# Patient Record
Sex: Male | Born: 1950
Health system: Southern US, Community
[De-identification: ages and names within clinical notes are randomized; demographics above are authoritative.]

## PROBLEM LIST (undated history)

## (undated) DIAGNOSIS — E119 Type 2 diabetes mellitus without complications: Secondary | ICD-10-CM

## (undated) DIAGNOSIS — I10 Essential (primary) hypertension: Secondary | ICD-10-CM

## (undated) DIAGNOSIS — R0683 Snoring: Secondary | ICD-10-CM

## (undated) DIAGNOSIS — E785 Hyperlipidemia, unspecified: Secondary | ICD-10-CM

## (undated) HISTORY — PX: KNEE SURGERY: SHX244

## (undated) HISTORY — PX: BACK SURGERY: SHX140

## (undated) HISTORY — DX: Snoring: R06.83

## (undated) HISTORY — PX: COLONOSCOPY: SHX174

## (undated) HISTORY — PX: KNEE ARTHROSCOPY: SHX127

## (undated) HISTORY — PX: OTHER SURGICAL HISTORY: SHX169

---

## 1997-12-21 ENCOUNTER — Ambulatory Visit (HOSPITAL_COMMUNITY): Admission: RE | Admit: 1997-12-21 | Discharge: 1997-12-21 | Payer: Self-pay | Admitting: Family Medicine

## 1999-01-23 ENCOUNTER — Encounter: Admission: RE | Admit: 1999-01-23 | Discharge: 1999-04-23 | Payer: Self-pay | Admitting: Family Medicine

## 1999-06-21 ENCOUNTER — Encounter: Admission: RE | Admit: 1999-06-21 | Discharge: 1999-09-19 | Payer: Self-pay | Admitting: Family Medicine

## 2005-07-11 ENCOUNTER — Ambulatory Visit (HOSPITAL_BASED_OUTPATIENT_CLINIC_OR_DEPARTMENT_OTHER): Admission: RE | Admit: 2005-07-11 | Discharge: 2005-07-11 | Payer: Self-pay | Admitting: Orthopedic Surgery

## 2009-02-15 ENCOUNTER — Encounter: Admission: RE | Admit: 2009-02-15 | Discharge: 2009-02-15 | Payer: Self-pay | Admitting: Internal Medicine

## 2009-03-09 ENCOUNTER — Encounter: Admission: RE | Admit: 2009-03-09 | Discharge: 2009-03-09 | Payer: Self-pay | Admitting: Orthopedic Surgery

## 2009-03-30 ENCOUNTER — Ambulatory Visit (HOSPITAL_COMMUNITY): Admission: RE | Admit: 2009-03-30 | Discharge: 2009-03-31 | Payer: Self-pay | Admitting: Neurosurgery

## 2010-06-25 ENCOUNTER — Encounter: Payer: Self-pay | Admitting: Internal Medicine

## 2010-09-07 LAB — BASIC METABOLIC PANEL
BUN: 10 mg/dL (ref 6–23)
CO2: 28 mEq/L (ref 19–32)
Calcium: 9.7 mg/dL (ref 8.4–10.5)
Chloride: 106 mEq/L (ref 96–112)
Creatinine, Ser: 0.96 mg/dL (ref 0.4–1.5)
GFR calc Af Amer: >60 mL/min (ref >60)
GFR calc non Af Amer: >60 mL/min (ref >60)
Glucose, Bld: 95 mg/dL (ref 70–99)
Potassium: 4.3 mEq/L (ref 3.5–5.1)
Sodium: 142 mEq/L (ref 135–145)

## 2010-09-07 LAB — CBC
HCT: 45.7 % (ref 39.0–52.0)
Hemoglobin: 16 g/dL (ref 13.0–17.0)
MCHC: 35 g/dL (ref 30.0–36.0)
MCV: 90.7 fL (ref 78.0–100.0)
Platelets: 234 10*3/uL (ref 150–400)
RBC: 5.04 MIL/uL (ref 4.22–5.81)
RDW: 14.2 % (ref 11.5–15.5)
WBC: 6.6 10*3/uL (ref 4.0–10.5)

## 2010-10-20 NOTE — Op Note (Signed)
NAME:  Jeffrey Woodward, Jeffrey Woodward NO.:  [id number]   MEDICAL RECORD NO.:  [id number]          PATIENT TYPE:  AMB   LOCATION:  DSC                          FACILITY:  [facility]   PHYSICIAN:  Mila Homer. Sherlean Foot, M.D. DATE OF BIRTH:  [birth year]   DATE OF PROCEDURE:  07/11/2005  DATE OF DISCHARGE:                                 OPERATIVE REPORT   PREOPERATIVE DIAGNOSIS:  Right knee osteoarthritis and medial meniscal tear.   POSTOPERATIVE DIAGNOSIS:  Right knee osteoarthritis and medial meniscal  tear.   OPERATION PERFORMED:  Right knee arthroscopy, partial medial meniscectomy,  chondroplasty in the medial and patellofemoral compartments.   SURGEON:  Mila Homer. Sherlean Foot, M.D.   ASSISTANT:  None.   ANESTHESIA:  MAC.   INDICATIONS FOR PROCEDURE:  The patient is a 60 year old white male with  mechanical symptoms as well as some chronic underlying osteoarthritic  symptoms.  Radiographic evidence of some arthritis as well.  No MRI was  performed.  Informed consent was obtained.   DESCRIPTION OF PROCEDURE:  The patient was laid supine and administered MAC  anesthesia.  The right lower extremity was prepped and draped in the usual  sterile fashion.  Inferolateral and inferomedial portals were created with a  #11 blade, blunt trocar and cannula.  Diagnostic arthroscopy revealed  minimal chondromalacia on the patella.  In the superior trochlea, the  cartilage looked good but down in the inferior portion it got down to grade  3 chondromalacia very quickly.  I debrided this area with a [place]  shaver, removing loose articular surface margins.  I then went into the  medial compartment where he had significant grade 3 changes throughout most  of the medial compartment as well as a very complex posterior horn medial  meniscal tear.  I used the straight basket forceps and the [employer] as well as the ArthroCare debridement want to perform partial medial  meniscectomy.  The  lateral compartment looked very good.  No pathology was  there.  The ACL and PCL looked good but there were osteophytes in the notch.  I then went back to the patellofemoral joint, debrided some more there,  finished the chondroplasty and then lavaged and closed with 4-0 nylon  sutures.  I then dressed with Xeroform dressing sponges, sterile Webril and  an Ace wrap.  I did infiltrate 10 mL of a Marcaine morphine mixture in both  portals.   COMPLICATIONS:  None.   DRAINS:  None.   TOURNIQUET TIME:  None.           ______________________________  Mila Homer. Sherlean Foot, M.D.     SDL/MEDQ  D:  07/11/2005  T:  07/11/2005  Job:  [id number]

## 2011-12-03 ENCOUNTER — Other Ambulatory Visit: Payer: Self-pay | Admitting: Cardiology

## 2013-04-08 ENCOUNTER — Other Ambulatory Visit: Payer: Self-pay | Admitting: Internal Medicine

## 2013-06-08 ENCOUNTER — Ambulatory Visit
Admission: RE | Admit: 2013-06-08 | Discharge: 2013-06-08 | Disposition: A | Discharge status: Home / Self Care | Payer: BC Managed Care – PPO | Source: Ambulatory Visit | Attending: Internal Medicine | Admitting: Internal Medicine

## 2013-06-08 DIAGNOSIS — R740 Nonspecific elevation of levels of transaminase and lactic acid dehydrogenase [LDH]: Principal | ICD-10-CM

## 2013-06-08 DIAGNOSIS — R7402 Elevation of levels of lactic acid dehydrogenase (LDH): Secondary | ICD-10-CM

## 2014-07-20 DIAGNOSIS — L821 Other seborrheic keratosis: Secondary | ICD-10-CM | POA: Diagnosis not present

## 2014-07-20 DIAGNOSIS — D1801 Hemangioma of skin and subcutaneous tissue: Secondary | ICD-10-CM | POA: Diagnosis not present

## 2014-07-20 DIAGNOSIS — B351 Tinea unguium: Secondary | ICD-10-CM | POA: Diagnosis not present

## 2014-07-20 DIAGNOSIS — B369 Superficial mycosis, unspecified: Secondary | ICD-10-CM | POA: Diagnosis not present

## 2014-07-20 DIAGNOSIS — L814 Other melanin hyperpigmentation: Secondary | ICD-10-CM | POA: Diagnosis not present

## 2014-10-28 DIAGNOSIS — M5116 Intervertebral disc disorders with radiculopathy, lumbar region: Secondary | ICD-10-CM | POA: Diagnosis not present

## 2014-10-28 DIAGNOSIS — M9901 Segmental and somatic dysfunction of cervical region: Secondary | ICD-10-CM | POA: Diagnosis not present

## 2014-10-28 DIAGNOSIS — M542 Cervicalgia: Secondary | ICD-10-CM | POA: Diagnosis not present

## 2014-10-28 DIAGNOSIS — M9903 Segmental and somatic dysfunction of lumbar region: Secondary | ICD-10-CM | POA: Diagnosis not present

## 2014-10-28 DIAGNOSIS — M9902 Segmental and somatic dysfunction of thoracic region: Secondary | ICD-10-CM | POA: Diagnosis not present

## 2014-10-31 ENCOUNTER — Emergency Department (HOSPITAL_COMMUNITY): Payer: BLUE CROSS/BLUE SHIELD

## 2014-10-31 ENCOUNTER — Encounter (HOSPITAL_COMMUNITY): Payer: Self-pay | Admitting: Family Medicine

## 2014-10-31 ENCOUNTER — Inpatient Hospital Stay (HOSPITAL_COMMUNITY)
Admission: EM | Admit: 2014-10-31 | Discharge: 2014-11-03 | DRG: 439 | Disposition: A | Discharge status: Home / Self Care | Payer: BLUE CROSS/BLUE SHIELD | Attending: Internal Medicine | Admitting: Internal Medicine

## 2014-10-31 DIAGNOSIS — R079 Chest pain, unspecified: Secondary | ICD-10-CM | POA: Diagnosis not present

## 2014-10-31 DIAGNOSIS — K859 Acute pancreatitis without necrosis or infection, unspecified: Secondary | ICD-10-CM | POA: Diagnosis present

## 2014-10-31 DIAGNOSIS — E872 Acidosis: Secondary | ICD-10-CM | POA: Diagnosis present

## 2014-10-31 DIAGNOSIS — E119 Type 2 diabetes mellitus without complications: Secondary | ICD-10-CM | POA: Diagnosis present

## 2014-10-31 DIAGNOSIS — R112 Nausea with vomiting, unspecified: Secondary | ICD-10-CM

## 2014-10-31 DIAGNOSIS — K760 Fatty (change of) liver, not elsewhere classified: Secondary | ICD-10-CM | POA: Diagnosis present

## 2014-10-31 DIAGNOSIS — I10 Essential (primary) hypertension: Secondary | ICD-10-CM | POA: Diagnosis not present

## 2014-10-31 DIAGNOSIS — K30 Functional dyspepsia: Secondary | ICD-10-CM | POA: Diagnosis present

## 2014-10-31 DIAGNOSIS — E781 Pure hyperglyceridemia: Secondary | ICD-10-CM | POA: Diagnosis present

## 2014-10-31 DIAGNOSIS — R0602 Shortness of breath: Secondary | ICD-10-CM | POA: Diagnosis not present

## 2014-10-31 DIAGNOSIS — R001 Bradycardia, unspecified: Secondary | ICD-10-CM | POA: Diagnosis present

## 2014-10-31 DIAGNOSIS — E780 Pure hypercholesterolemia: Secondary | ICD-10-CM | POA: Diagnosis present

## 2014-10-31 DIAGNOSIS — Z6834 Body mass index (BMI) 34.0-34.9, adult: Secondary | ICD-10-CM

## 2014-10-31 DIAGNOSIS — R0789 Other chest pain: Secondary | ICD-10-CM | POA: Diagnosis not present

## 2014-10-31 DIAGNOSIS — Z7982 Long term (current) use of aspirin: Secondary | ICD-10-CM

## 2014-10-31 HISTORY — DX: Type 2 diabetes mellitus without complications: E11.9

## 2014-10-31 HISTORY — DX: Hyperlipidemia, unspecified: E78.5

## 2014-10-31 HISTORY — DX: Essential (primary) hypertension: I10

## 2014-10-31 LAB — CBC
HCT: 46.5 % (ref 39.0–52.0)
Hemoglobin: 16.7 g/dL (ref 13.0–17.0)
MCH: 31.5 pg (ref 26.0–34.0)
MCHC: 35.9 g/dL (ref 30.0–36.0)
MCV: 87.6 fL (ref 78.0–100.0)
PLATELETS: 243 10*3/uL (ref 150–400)
RBC: 5.31 MIL/uL (ref 4.22–5.81)
RDW: 14 % (ref 11.5–15.5)
WBC: 10.1 10*3/uL (ref 4.0–10.5)

## 2014-10-31 LAB — BASIC METABOLIC PANEL
Anion gap: 12 (ref 5–15)
BUN: 17 mg/dL (ref 6–20)
CALCIUM: 8.9 mg/dL (ref 8.9–10.3)
CO2: 20 mmol/L — ABNORMAL LOW (ref 22–32)
Chloride: 103 mmol/L (ref 101–111)
Creatinine, Ser: 1.1 mg/dL (ref 0.61–1.24)
GFR calc non Af Amer: >60 mL/min (ref >60)
Glucose, Bld: 169 mg/dL — ABNORMAL HIGH (ref 65–99)
Potassium: 4.1 mmol/L (ref 3.5–5.1)
Sodium: 135 mmol/L (ref 135–145)

## 2014-10-31 LAB — CBG MONITORING, ED: Glucose-Capillary: 153 mg/dL — ABNORMAL HIGH (ref 65–99)

## 2014-10-31 LAB — LIPASE, BLOOD: Lipase: 67 U/L — ABNORMAL HIGH (ref 22–51)

## 2014-10-31 LAB — GLUCOSE, CAPILLARY: GLUCOSE-CAPILLARY: 144 mg/dL — AB (ref 65–99)

## 2014-10-31 LAB — BRAIN NATRIURETIC PEPTIDE: B NATRIURETIC PEPTIDE 5: 12.2 pg/mL (ref 0.0–100.0)

## 2014-10-31 LAB — I-STAT TROPONIN, ED: TROPONIN I, POC: 0 ng/mL (ref 0.00–0.08)

## 2014-10-31 MED ORDER — MORPHINE SULFATE 4 MG/ML IJ SOLN
4.0000 mg | Freq: Once | INTRAMUSCULAR | Status: AC
  Administered 2014-10-31: 4 mg via INTRAVENOUS
  Filled 2014-10-31: qty 1

## 2014-10-31 MED ORDER — IOHEXOL 300 MG/ML  SOLN
25.0000 mL | INTRAMUSCULAR | Status: DC
  Administered 2014-11-01: 25 mL via ORAL

## 2014-10-31 MED ORDER — MORPHINE SULFATE 2 MG/ML IJ SOLN
2.0000 mg | INTRAMUSCULAR | Status: DC | PRN
  Administered 2014-11-01: 2 mg via INTRAVENOUS
  Filled 2014-10-31: qty 1

## 2014-10-31 MED ORDER — SODIUM CHLORIDE 0.9 % IV SOLN
INTRAVENOUS | Status: DC
  Administered 2014-10-31: via INTRAVENOUS

## 2014-10-31 MED ORDER — ACETAMINOPHEN 325 MG PO TABS: 650.0000 mg | ORAL_TABLET | ORAL | Status: DC | PRN

## 2014-10-31 MED ORDER — IOHEXOL 350 MG/ML SOLN
80.0000 mL | Freq: Once | INTRAVENOUS | Status: AC | PRN
  Administered 2014-10-31: 100 mL via INTRAVENOUS

## 2014-10-31 MED ORDER — INSULIN ASPART 100 UNIT/ML ~~LOC~~ SOLN
0.0000 [IU] | Freq: Three times a day (TID) | SUBCUTANEOUS | Status: DC
  Administered 2014-11-01: 1 [IU] via SUBCUTANEOUS
  Administered 2014-11-01 – 2014-11-02 (×2): 2 [IU] via SUBCUTANEOUS

## 2014-10-31 MED ORDER — GEMFIBROZIL 600 MG PO TABS
600.0000 mg | ORAL_TABLET | Freq: Two times a day (BID) | ORAL | Status: DC
  Administered 2014-11-01 – 2014-11-03 (×5): 600 mg via ORAL
  Filled 2014-10-31 (×7): qty 1

## 2014-10-31 MED ORDER — OMEGA-3-ACID ETHYL ESTERS 1 G PO CAPS
1.0000 g | ORAL_CAPSULE | Freq: Two times a day (BID) | ORAL | Status: DC
  Administered 2014-11-01 – 2014-11-03 (×5): 1 g via ORAL
  Filled 2014-10-31 (×7): qty 1

## 2014-10-31 MED ORDER — LOSARTAN POTASSIUM 50 MG PO TABS
100.0000 mg | ORAL_TABLET | Freq: Every day | ORAL | Status: DC
  Administered 2014-11-01 – 2014-11-03 (×3): 100 mg via ORAL
  Filled 2014-10-31 (×3): qty 2

## 2014-10-31 MED ORDER — ONDANSETRON HCL 4 MG/2ML IJ SOLN
4.0000 mg | Freq: Once | INTRAMUSCULAR | Status: AC
  Administered 2014-10-31: 4 mg via INTRAVENOUS
  Filled 2014-10-31: qty 2

## 2014-10-31 MED ORDER — ASPIRIN EC 325 MG PO TBEC
325.0000 mg | DELAYED_RELEASE_TABLET | Freq: Every day | ORAL | Status: DC
  Administered 2014-11-01 – 2014-11-03 (×3): 325 mg via ORAL
  Filled 2014-10-31 (×3): qty 1

## 2014-10-31 MED ORDER — GLIMEPIRIDE 1 MG PO TABS
1.0000 mg | ORAL_TABLET | Freq: Every day | ORAL | Status: DC
  Filled 2014-10-31: qty 1

## 2014-10-31 MED ORDER — ONDANSETRON HCL 4 MG/2ML IJ SOLN
4.0000 mg | Freq: Once | INTRAMUSCULAR | Status: AC
  Administered 2014-10-31: 4 mg via INTRAVENOUS

## 2014-10-31 MED ORDER — GI COCKTAIL ~~LOC~~
30.0000 mL | Freq: Once | ORAL | Status: AC
  Administered 2014-10-31: 30 mL via ORAL
  Filled 2014-10-31: qty 30

## 2014-10-31 MED ORDER — ASPIRIN 81 MG PO CHEW
324.0000 mg | CHEWABLE_TABLET | Freq: Once | ORAL | Status: AC
  Administered 2014-10-31: 324 mg via ORAL
  Filled 2014-10-31: qty 4

## 2014-10-31 MED ORDER — ENOXAPARIN SODIUM 40 MG/0.4ML ~~LOC~~ SOLN
40.0000 mg | SUBCUTANEOUS | Status: DC
  Administered 2014-11-01 – 2014-11-03 (×3): 40 mg via SUBCUTANEOUS
  Filled 2014-10-31 (×3): qty 0.4

## 2014-10-31 MED ORDER — ONDANSETRON HCL 4 MG/2ML IJ SOLN: 4.0000 mg | Freq: Four times a day (QID) | INTRAMUSCULAR | Status: DC | PRN

## 2014-10-31 MED ORDER — NITROGLYCERIN 0.4 MG SL SUBL
0.4000 mg | SUBLINGUAL_TABLET | SUBLINGUAL | Status: DC | PRN
  Administered 2014-10-31 (×2): 0.4 mg via SUBLINGUAL
  Filled 2014-10-31: qty 1

## 2014-10-31 MED ORDER — HYDRALAZINE HCL 20 MG/ML IJ SOLN
5.0000 mg | INTRAMUSCULAR | Status: DC | PRN
  Administered 2014-11-01: 5 mg via INTRAVENOUS
  Filled 2014-10-31: qty 1

## 2014-10-31 MED ORDER — FISH OIL 1000 MG PO CAPS: 1000.0000 mg | ORAL_CAPSULE | Freq: Two times a day (BID) | ORAL | Status: DC

## 2014-10-31 NOTE — ED Notes (Signed)
Pt here for right sided chest pain, shoulder pain radiating under breast with nausea, diaphoresis and dizziness that has been getting worse throughout the day.

## 2014-10-31 NOTE — ED Notes (Signed)
Admitting MD at bedside.

## 2014-10-31 NOTE — ED Notes (Signed)
Patient transported to X-ray 

## 2014-10-31 NOTE — H&P (Signed)
[facility] History and Physical  Jeffrey Woodward YQM:[id number] DOB: 1950-08-01 DOA: 10/31/2014  Referring physician: Dr.Pollino. PCP: Jani Gravel, MD  Specialists: None.  Chief Complaint: Right scapular pain with nausea and vomiting.  HPI: Jeffrey Woodward is a 64 y.o. male with history of diabetes mellitus type 2, hypertension, hypertriglyceridemia presents to the ER because of sudden onset of right scapular pain with heartburn and nausea vomiting. Patient states at around 3 PM patient started developing right scapular pain which was radiating to his right chest. Eventually patient started having nausea and vomiting at least 3-4 episodes. Patient started having heartburn after the vomiting. In the ER patient had CT angiogram of the chest which was negative for anything acute except for chronic granulomatous changes. EKG was showing nonspecific changes in the inferior leads and troponin was negative. Labs show mildly elevated lipase. LFTs are pending. Patient's blood pressure also was found to be elevated and was having episodes of sinus bradycardia into the 40s. Patient will be admitted for further management. Patient still has mild right scapula pain. Patient only had one more episode of nausea vomiting after patient was given sublingual nitroglycerin. The abdomen on exam appears benign. Patient states he was diagnosed with hypertriglyceridemia and his triglycerides were as high as 1500.   Review of Systems: As presented in the history of presenting illness, rest negative.  Past Medical History  Diagnosis Date  . Hypertension   . Diabetes mellitus without complication   . Hyperlipidemia    Past Surgical History  Procedure Laterality Date  . Back surgery     Social History:  reports that he has never smoked. He does not have any smokeless tobacco history on file. He reports that he does not drink alcohol or use illicit drugs. Where does patient live home. Can patient participate  in ADLs? Yes.  No Known Allergies  Family History:  Family History  Problem Relation Age of Onset  . CAD Mother   . Diabetes Mellitus II Mother   . CAD Father   . Stroke Father   . Diabetes Mellitus II Father       Prior to Admission medications   Medication Sig Start Date End Date Taking? Authorizing Provider  aspirin EC 81 MG tablet Take 81 mg by mouth daily.   Yes Historical Provider, MD  gemfibrozil (LOPID) 600 MG tablet Take 600 mg by mouth 2 (two) times daily before a meal.  10/22/14  Yes Historical Provider, MD  glimepiride (AMARYL) 1 MG tablet Take 1 mg by mouth daily after supper.  10/28/14  Yes Historical Provider, MD  losartan (COZAAR) 100 MG tablet Take 100 mg by mouth daily.  09/22/14  Yes Historical Provider, MD  Omega-3 Fatty Acids (FISH OIL) 1000 MG CAPS Take 1,000 mg by mouth 2 (two) times daily.   Yes Historical Provider, MD    Physical Exam: Filed Vitals:   10/31/14 2230 10/31/14 2245 10/31/14 2300 10/31/14 2315  BP: 166/73 169/76 186/90 182/96  Pulse: 48 52 57 60  Temp:      TempSrc:      Resp:   15 9  SpO2: 95% 98% 95% 99%     General:  Well-developed and nourished.  Eyes: Anicteric no pallor.  ENT: No discharge from the ears eyes nose and mouth.  Neck: No mass felt. No JVD appreciated.  Cardiovascular: S1-S2 heard.  Respiratory: No rhonchi or crepitations.  Abdomen: Soft nontender bowel sounds present. No guarding or rigidity.  Skin: No rash.  Musculoskeletal: No edema.  Psychiatric: Appears normal.  Neurologic: Alert awake oriented to time place and person. Moves all extremities.  Labs on Admission:  Basic Metabolic Panel:  Recent Labs Lab 10/31/14 1828  NA 135  K 4.1  CL 103  CO2 20*  GLUCOSE 169*  BUN 17  CREATININE 1.10  CALCIUM 8.9   Liver Function Tests: No results for input(s): AST, ALT, ALKPHOS, BILITOT, PROT, ALBUMIN in the last 168 hours.  Recent Labs Lab 10/31/14 1828  LIPASE 67*   No results for input(s):  AMMONIA in the last 168 hours. CBC:  Recent Labs Lab 10/31/14 1828  WBC 10.1  HGB 16.7  HCT 46.5  MCV 87.6  PLT 243   Cardiac Enzymes: No results for input(s): CKTOTAL, CKMB, CKMBINDEX, TROPONINI in the last 168 hours.  BNP (last 3 results)  Recent Labs  10/31/14 1828  BNP 12.2    ProBNP (last 3 results) No results for input(s): PROBNP in the last 8760 hours.  CBG:  Recent Labs Lab 10/31/14 2010  GLUCAP 153*    Radiological Exams on Admission: Dg Chest 2 View  10/31/2014   CLINICAL DATA:  Acute onset of right-sided chest pain, with nausea and vomiting. Initial encounter.  EXAM: CHEST  2 VIEW  COMPARISON:  Chest radiograph performed 03/30/2009  FINDINGS: The lungs are well-aerated. Small nodules are again noted at the right mid and lower lung zones, stable from 2010. There is no evidence of pleural effusion or pneumothorax.  The heart is normal in size; vascular congestion is noted. No acute osseous abnormalities are seen.  IMPRESSION: Vascular congestion noted. Small nodules at the right mid and lower lung zones, stable from 2010. Lungs otherwise grossly clear.   Electronically Signed   By: Garald Balding M.D.   On: 10/31/2014 18:48   Ct Angio Chest Pe W/cm &/or Wo Cm  10/31/2014   CLINICAL DATA:  Right-sided chest pain  EXAM: CT ANGIOGRAPHY CHEST WITH CONTRAST  TECHNIQUE: Multidetector CT imaging of the chest was performed using the standard protocol during bolus administration of intravenous contrast. Multiplanar CT image reconstructions and MIPs were obtained to evaluate the vascular anatomy.  CONTRAST:  154mL OMNIPAQUE IOHEXOL 350 MG/ML SOLN  COMPARISON:  None.  FINDINGS: The lungs are well aerated bilaterally. Calcified granulomas are noted which correspond to the nodule seen on recent chest x-ray. No sizable infiltrate or effusion is seen.  The thoracic inlet is within normal limits. The thoracic aorta as visualized is unremarkable although the contrast opacification is  limited. Multiple calcified hilar and mediastinal lymph nodes are noted consistent with prior granulomas disease. Pulmonary artery demonstrates a normal branching pattern. No filling defects to suggest pulmonary emboli are identified. The visualized upper abdomen shows multiple splenic granulomas. Mild coronary calcifications are seen. The osseous structures show no acute abnormality. Degenerative changes of the thoracic spine are seen.  Review of the MIP images confirms the above findings.  IMPRESSION: No evidence of pulmonary emboli.  Changes consistent with prior granulomatous disease.   Electronically Signed   By: Inez Catalina M.D.   On: 10/31/2014 20:53    EKG: Independently reviewed. Normal sinus rhythm with nonspecific ST-T changes in the inferior leads.  Assessment/Plan Principal Problem:   Chest pain Active Problems:   Hypertension, uncontrolled   Hypertriglyceridemia   Diabetes mellitus type 2, controlled   1. Right scapular pain with heartburn and nausea vomiting - cause not clear, differentials include ACS and GI causes like pancreatitis. At this time we'll  keep patient nothing by mouth. Patient has been placed on aspirin and when necessary nitroglycerin morphine for pain relief and we will cycle cardiac markers check 2-D echo. I have also ordered LFTs and repeat lipase and CT abdomen and pelvis with by mouth contrast as patient as received IV contrast only. Further recommendation based on the tests ordered. 2. Hypertension uncontrolled - in addition to patient's home medication and place patient on when necessary IV hydralazine. Closely follow blood pressure trends. 3. Diabetes mellitus type 2 controlled - since patient has received contrast we will hold metformin for 48 hours and patient has been placed on sliding scale coverage. 4. History of hypertriglyceridemia - check lipid panel. Patient is on Lopid and fish oil. 5. Metabolic acidosis probably from nausea vomiting - recheck  metabolic panel after hydrating.   DVT Prophylaxis Lovenox.  Code Status: Full code.  Family Communication: Patient's wife.  Disposition Plan: Admit for observation.    Jermane Brayboy N. [facility] Pager [phone].  If 7PM-7AM, please contact night-coverage [email or web address] Password [facility] 10/31/2014, 11:32 PM

## 2014-10-31 NOTE — ED Notes (Signed)
Patient returned from X-ray 

## 2014-10-31 NOTE — ED Notes (Signed)
Per CT, pt  Cannot have IV contrast for another 24 hours, plan to drink oral contrast.

## 2014-10-31 NOTE — ED Notes (Signed)
MD at bedside. Pallor somewhat improved.

## 2014-10-31 NOTE — ED Notes (Signed)
Pt continues to be diaphoretic and pale, heartburn/back pain unchanged.

## 2014-10-31 NOTE — ED Provider Notes (Signed)
CSN: [id number]     Arrival date & time 10/31/14  1806 History   First MD Initiated Contact with Patient 10/31/14 1824     Chief Complaint  Patient presents with  . Chest Pain  . Back Pain     (Consider location/radiation/quality/duration/timing/severity/associated sxs/prior Treatment) HPI Comments: Patient presents to the emergency department for evaluation of pain in the right side of his chest. Patient reports that around 3 PM today he had onset of pain under the right shoulder blade. Pain is radiating around the ribs to the front right side of the chest. Patient reports the pain has been constant and progressively worsening since it started. He was sitting at rest when the pain first began. He has not identified any alleviating or exacerbating factors. Pain does not worsen with movement of the torso and the area is not tender to the touch. He is feeling short of breath. Patient reports that he has been feeling clammy and he has had nausea and vomiting. He has not noticed any worsening pain with breathing.  Patient is a 64 y.o. male presenting with chest pain and back pain.  Chest Pain Associated symptoms: back pain, nausea, shortness of breath and vomiting   Back Pain Associated symptoms: chest pain     Past Medical History  Diagnosis Date  . Hypertension   . Diabetes mellitus without complication   . Hyperlipidemia    Past Surgical History  Procedure Laterality Date  . Back surgery     History reviewed. No pertinent family history. History  Substance Use Topics  . Smoking status: Never Smoker   . Smokeless tobacco: Not on file  . Alcohol Use: No    Review of Systems  Respiratory: Positive for shortness of breath.   Cardiovascular: Positive for chest pain.  Gastrointestinal: Positive for nausea and vomiting.  Musculoskeletal: Positive for back pain.  All other systems reviewed and are negative.     Allergies  Review of patient's allergies indicates no known  allergies.  Home Medications   Prior to Admission medications   Medication Sig Start Date End Date Taking? Authorizing Provider  aspirin EC 81 MG tablet Take 81 mg by mouth daily.   Yes Historical Provider, MD  gemfibrozil (LOPID) 600 MG tablet Take 600 mg by mouth 2 (two) times daily before a meal.  10/22/14  Yes Historical Provider, MD  glimepiride (AMARYL) 1 MG tablet Take 1 mg by mouth daily after supper.  10/28/14  Yes Historical Provider, MD  losartan (COZAAR) 100 MG tablet Take 100 mg by mouth daily.  09/22/14  Yes Historical Provider, MD  Omega-3 Fatty Acids (FISH OIL) 1000 MG CAPS Take 1,000 mg by mouth 2 (two) times daily.   Yes Historical Provider, MD   BP 166/69 mmHg  Pulse 51  Temp(Src) 97.6 F (36.4 C) (Oral)  Resp 15  SpO2 98% Physical Exam  Constitutional: He is oriented to person, place, and time. He appears well-developed and well-nourished. No distress.  HENT:  Head: Normocephalic and atraumatic.  Right Ear: Hearing normal.  Left Ear: Hearing normal.  Nose: Nose normal.  Mouth/Throat: Oropharynx is clear and moist and mucous membranes are normal.  Eyes: Conjunctivae and EOM are normal. Pupils are equal, round, and reactive to light.  Neck: Normal range of motion. Neck supple.  Cardiovascular: Regular rhythm, S1 normal and S2 normal.  Exam reveals no gallop and no friction rub.   No murmur heard. Pulmonary/Chest: Effort normal and breath sounds normal. No respiratory distress. He  exhibits no tenderness.  Abdominal: Soft. Normal appearance and bowel sounds are normal. There is no hepatosplenomegaly. There is no tenderness. There is no rebound, no guarding, no tenderness at McBurney's point and negative Murphy's sign. No hernia.  Musculoskeletal: Normal range of motion.  Neurological: He is alert and oriented to person, place, and time. He has normal strength. No cranial nerve deficit or sensory deficit. Coordination normal. GCS eye subscore is 4. GCS verbal subscore is  5. GCS motor subscore is 6.  Skin: Skin is warm, dry and intact. No rash noted. No cyanosis.  Psychiatric: He has a normal mood and affect. His speech is normal and behavior is normal. Thought content normal.  Nursing note and vitals reviewed.   ED Course  Procedures (including critical care time) Labs Review Labs Reviewed  BASIC METABOLIC PANEL - Abnormal; Notable for the following:    CO2 20 (*)    Glucose, Bld 169 (*)    All other components within normal limits  LIPASE, BLOOD - Abnormal; Notable for the following:    Lipase 67 (*)    All other components within normal limits  CBG MONITORING, ED - Abnormal; Notable for the following:    Glucose-Capillary 153 (*)    All other components within normal limits  CBC  BRAIN NATRIURETIC PEPTIDE  I-STAT TROPOININ, ED    Imaging Review Dg Chest 2 View  10/31/2014   CLINICAL DATA:  Acute onset of right-sided chest pain, with nausea and vomiting. Initial encounter.  EXAM: CHEST  2 VIEW  COMPARISON:  Chest radiograph performed 03/30/2009  FINDINGS: The lungs are well-aerated. Small nodules are again noted at the right mid and lower lung zones, stable from 2010. There is no evidence of pleural effusion or pneumothorax.  The heart is normal in size; vascular congestion is noted. No acute osseous abnormalities are seen.  IMPRESSION: Vascular congestion noted. Small nodules at the right mid and lower lung zones, stable from 2010. Lungs otherwise grossly clear.   Electronically Signed   By: Garald Balding M.D.   On: 10/31/2014 18:48   Ct Angio Chest Pe W/cm &/or Wo Cm  10/31/2014   CLINICAL DATA:  Right-sided chest pain  EXAM: CT ANGIOGRAPHY CHEST WITH CONTRAST  TECHNIQUE: Multidetector CT imaging of the chest was performed using the standard protocol during bolus administration of intravenous contrast. Multiplanar CT image reconstructions and MIPs were obtained to evaluate the vascular anatomy.  CONTRAST:  152mL OMNIPAQUE IOHEXOL 350 MG/ML SOLN   COMPARISON:  None.  FINDINGS: The lungs are well aerated bilaterally. Calcified granulomas are noted which correspond to the nodule seen on recent chest x-ray. No sizable infiltrate or effusion is seen.  The thoracic inlet is within normal limits. The thoracic aorta as visualized is unremarkable although the contrast opacification is limited. Multiple calcified hilar and mediastinal lymph nodes are noted consistent with prior granulomas disease. Pulmonary artery demonstrates a normal branching pattern. No filling defects to suggest pulmonary emboli are identified. The visualized upper abdomen shows multiple splenic granulomas. Mild coronary calcifications are seen. The osseous structures show no acute abnormality. Degenerative changes of the thoracic spine are seen.  Review of the MIP images confirms the above findings.  IMPRESSION: No evidence of pulmonary emboli.  Changes consistent with prior granulomatous disease.   Electronically Signed   By: Inez Catalina M.D.   On: 10/31/2014 20:53     EKG Interpretation   Date/Time:  Sunday Oct 31 2014 19:59:40 EDT Ventricular Rate:  56 PR Interval:  165 QRS Duration: 110 QT Interval:  472 QTC Calculation: 455 R Axis:   130 Text Interpretation:  Sinus rhythm Right axis deviation Low voltage,  precordial leads Baseline wander in lead(s) II No significant change since  last tracing Confirmed by Stacie Knutzen  MD, [place] [phone]) on 10/31/2014  8:32:07 PM      MDM   Final diagnoses:  Chest pain    Patient presents to the ER for evaluation of back pain, chest pain. Symptoms began at 3 PM today. Patient reports onset of pain in the area of the right shoulder blade that radiates around into the front. He has also developed a burning heaviness in the center of his chest which he feels is indigestion. Symptoms have been persistent since they began. He has not identified any exacerbating factors. Pain is not reproducible here in the ER. There is no abdominal  discomfort. Patient's EKG does not show obvious ischemia or infarct. Troponin was negative. PE and aortic dissection was considered. CT angiography of chest was unremarkable.  Patient has multiple cardiac risk factors including hypertension and high cholesterol, diabetes. He does not have any known coronary artery disease. Patient was very hypertensive at arrival, this improved with nitroglycerin. He is also been bradycardic here in the ER, etiology is unclear. Patient's initial cardiac workup negative, will require further workup for possible cardiac etiology of his pain.      Orpah Greek, MD 10/31/14 2224

## 2014-11-01 ENCOUNTER — Observation Stay (HOSPITAL_COMMUNITY): Payer: BLUE CROSS/BLUE SHIELD

## 2014-11-01 DIAGNOSIS — K30 Functional dyspepsia: Secondary | ICD-10-CM | POA: Diagnosis present

## 2014-11-01 DIAGNOSIS — K760 Fatty (change of) liver, not elsewhere classified: Secondary | ICD-10-CM | POA: Diagnosis present

## 2014-11-01 DIAGNOSIS — R112 Nausea with vomiting, unspecified: Secondary | ICD-10-CM | POA: Diagnosis not present

## 2014-11-01 DIAGNOSIS — I10 Essential (primary) hypertension: Secondary | ICD-10-CM | POA: Diagnosis not present

## 2014-11-01 DIAGNOSIS — R001 Bradycardia, unspecified: Secondary | ICD-10-CM | POA: Diagnosis present

## 2014-11-01 DIAGNOSIS — Z6834 Body mass index (BMI) 34.0-34.9, adult: Secondary | ICD-10-CM | POA: Diagnosis not present

## 2014-11-01 DIAGNOSIS — K8020 Calculus of gallbladder without cholecystitis without obstruction: Secondary | ICD-10-CM | POA: Diagnosis not present

## 2014-11-01 DIAGNOSIS — K859 Acute pancreatitis without necrosis or infection, unspecified: Secondary | ICD-10-CM | POA: Diagnosis present

## 2014-11-01 DIAGNOSIS — E119 Type 2 diabetes mellitus without complications: Secondary | ICD-10-CM | POA: Diagnosis not present

## 2014-11-01 DIAGNOSIS — E872 Acidosis: Secondary | ICD-10-CM | POA: Diagnosis present

## 2014-11-01 DIAGNOSIS — E781 Pure hyperglyceridemia: Secondary | ICD-10-CM | POA: Diagnosis not present

## 2014-11-01 DIAGNOSIS — R079 Chest pain, unspecified: Secondary | ICD-10-CM | POA: Diagnosis not present

## 2014-11-01 DIAGNOSIS — N281 Cyst of kidney, acquired: Secondary | ICD-10-CM | POA: Diagnosis not present

## 2014-11-01 DIAGNOSIS — Z7982 Long term (current) use of aspirin: Secondary | ICD-10-CM | POA: Diagnosis not present

## 2014-11-01 DIAGNOSIS — E780 Pure hypercholesterolemia: Secondary | ICD-10-CM | POA: Diagnosis present

## 2014-11-01 DIAGNOSIS — K858 Other acute pancreatitis: Secondary | ICD-10-CM

## 2014-11-01 DIAGNOSIS — R0789 Other chest pain: Secondary | ICD-10-CM | POA: Diagnosis not present

## 2014-11-01 LAB — COMPREHENSIVE METABOLIC PANEL
ALT: 56 U/L (ref 17–63)
ANION GAP: 10 (ref 5–15)
AST: 31 U/L (ref 15–41)
Albumin: 3.7 g/dL (ref 3.5–5.0)
Alkaline Phosphatase: 86 U/L (ref 38–126)
BUN: 14 mg/dL (ref 6–20)
CALCIUM: 8.7 mg/dL — AB (ref 8.9–10.3)
CO2: 23 mmol/L (ref 22–32)
Chloride: 104 mmol/L (ref 101–111)
Creatinine, Ser: 0.97 mg/dL (ref 0.61–1.24)
GFR calc non Af Amer: >60 mL/min (ref >60)
Glucose, Bld: 153 mg/dL — ABNORMAL HIGH (ref 65–99)
POTASSIUM: 3.6 mmol/L (ref 3.5–5.1)
Sodium: 137 mmol/L (ref 135–145)
Total Bilirubin: 1 mg/dL (ref 0.3–1.2)
Total Protein: 7.1 g/dL (ref 6.5–8.1)

## 2014-11-01 LAB — LIPID PANEL
CHOL/HDL RATIO: 8.3 ratio
Cholesterol: 257 mg/dL — ABNORMAL HIGH (ref 0–200)
HDL: 31 mg/dL — ABNORMAL LOW (ref >40)
LDL CALC: UNDETERMINED mg/dL (ref 0–99)
Triglycerides: 688 mg/dL — ABNORMAL HIGH (ref <150)
VLDL: UNDETERMINED mg/dL (ref 0–40)

## 2014-11-01 LAB — CBC WITH DIFFERENTIAL/PLATELET
BASOS PCT: 0 % (ref 0–1)
Basophils Absolute: 0 10*3/uL (ref 0.0–0.1)
Eosinophils Absolute: 0 10*3/uL (ref 0.0–0.7)
Eosinophils Relative: 0 % (ref 0–5)
HCT: 45.1 % (ref 39.0–52.0)
Hemoglobin: 16 g/dL (ref 13.0–17.0)
Lymphocytes Relative: 9 % — ABNORMAL LOW (ref 12–46)
Lymphs Abs: 1.6 10*3/uL (ref 0.7–4.0)
MCH: 31.4 pg (ref 26.0–34.0)
MCHC: 35.5 g/dL (ref 30.0–36.0)
MCV: 88.4 fL (ref 78.0–100.0)
MONOS PCT: 7 % (ref 3–12)
Monocytes Absolute: 1.2 10*3/uL — ABNORMAL HIGH (ref 0.1–1.0)
NEUTROS PCT: 84 % — AB (ref 43–77)
Neutro Abs: 14.9 10*3/uL — ABNORMAL HIGH (ref 1.7–7.7)
PLATELETS: 243 10*3/uL (ref 150–400)
RBC: 5.1 MIL/uL (ref 4.22–5.81)
RDW: 14.2 % (ref 11.5–15.5)
WBC: 17.8 10*3/uL — AB (ref 4.0–10.5)

## 2014-11-01 LAB — TROPONIN I: Troponin I: <0.03 ng/mL (ref <0.031)

## 2014-11-01 LAB — MRSA PCR SCREENING: MRSA BY PCR: NEGATIVE

## 2014-11-01 LAB — LIPASE, BLOOD: Lipase: 19 U/L — ABNORMAL LOW (ref 22–51)

## 2014-11-01 LAB — GLUCOSE, CAPILLARY
Glucose-Capillary: 152 mg/dL — ABNORMAL HIGH (ref 65–99)
Glucose-Capillary: 139 mg/dL — ABNORMAL HIGH (ref 65–99)
Glucose-Capillary: 178 mg/dL — ABNORMAL HIGH (ref 65–99)
Glucose-Capillary: 133 mg/dL — ABNORMAL HIGH (ref 65–99)

## 2014-11-01 MED ORDER — SODIUM CHLORIDE 0.9 % IV SOLN
INTRAVENOUS | Status: DC
  Administered 2014-11-02: 22:00:00 via INTRAVENOUS

## 2014-11-01 NOTE — Progress Notes (Signed)
[facility] TEAM 1 - Stepdown/ICU TEAM Progress Note  Jeffrey Woodward BEM:[id number] DOB: Mar 29, 1951 DOA: 10/31/2014 PCP: Jani Gravel, MD  Admit HPI / Brief Narrative: 64 y.o. male with history of diabetes mellitus, hypertension, and hypertriglyceridemia who presented to the ER because of sudden onset of right scapular pain with heartburn and nausea / vomiting.   In the ER CT angiogram of the chest noted only chronic granulomatous changes. EKG noted nonspecific changes in the inferior leads and troponin was negative. Labs show mildly elevated lipase. Patient's blood pressure was elevated and he was having episodes of sinus bradycardia into the 40s.  HPI/Subjective: The patient is resting comfortably in bed at present.  He reports he still has very mild scapular pain with heartburn and nausea but that his symptoms are significantly improved compared to yesterday.  He denies substernal chest pressure, dyspnea on exertion, or shortness of breath with rest.  Assessment/Plan:  Right scapular pain suspect this is referred pain due to pancreatitis - agree w/ cycling cardiac enzymes - pt is followed by Dr. Wynonia Lawman - will consult Cards if troponin trends upward - recheck EKG in a.m. - hold on TTE unless other concerning findings arise  Heartburn w/ nausea / vomiting - pancreatitis  CT abdomen unrevealing but lipase elevated suggesting pancreatitis - likely related to severe hypertriglyceridemia but can't rule out passing of a gallstone - LFTs normal arguing against impacted gallstone but they were not checked at his presentation - lipid panel pending - clear liquids only - follow clinically - counseled patient extensively on the need for weight loss and triglyceride control  Metabolic acidosis  Resolved  Hypertriglyceridemia - HLD This is a long-standing known issue - the patient reports he has been referred to [facility] in the past and has tried multiple different combination therapies - lipid panel  pending - he is advised that weight loss should also be an important part of his treatment plan  Uncontrolled HTN BP control improving - follow   DM CBG currently reasonably controlled - follow trend  Obesity - Body mass index is 34.26 kg/(m^2).  As noted above patient has been advised that weight loss is important part of his treatment plan  Hepatic steatosis  Noted on [place] as early as Jan 2015 - discussed need for weight loss to improve this finding and the possibility of long-term liver damage if this persists for years  Code Status: FULL Family Communication: Spoke with wife and daughter at bedside Disposition Plan: Complete cardiac workup by cycling enzymes - follow symptoms on clear liquids - advance diet in morning - possible discharge 5/31  Consultants: none  Procedures: none  Antibiotics: none  DVT prophylaxis: lovenox   Objective: Blood pressure 136/59, pulse 95, temperature 98 F (36.7 C), temperature source Oral, resp. rate 19, height 5\' 10"  (1.778 m), weight 108.3 kg (238 lb 12.1 oz), SpO2 93 %.  Intake/Output Summary (Last 24 hours) at 11/01/14 0810 Last data filed at 11/01/14 0100  Gross per 24 hour  Intake 121.67 ml  Output    200 ml  Net -78.33 ml   Exam: General: No acute respiratory distress Lungs: Clear to auscultation bilaterally without wheezes or crackles Cardiovascular: Regular rate and rhythm without murmur gallop or rub normal S1 and S2 Abdomen: Nontender, obese, soft, bowel sounds positive, no rebound, no ascites, no appreciable mass Extremities: No significant cyanosis, clubbing, or edema bilateral lower extremities  Data Reviewed: Basic Metabolic Panel:  Recent Labs Lab 10/31/14 1828  NA 135  K 4.1  CL 103  CO2 20*  GLUCOSE 169*  BUN 17  CREATININE 1.10  CALCIUM 8.9    CBC:  Recent Labs Lab 10/31/14 1828 11/01/14 0613  WBC 10.1 17.8*  NEUTROABS  --  14.9*  HGB 16.7 16.0  HCT 46.5 45.1  MCV 87.6 88.4  PLT 243 243    Liver Function Tests: No results for input(s): AST, ALT, ALKPHOS, BILITOT, PROT, ALBUMIN in the last 168 hours.  Recent Labs Lab 10/31/14 1828  LIPASE 67*   Cardiac Enzymes:  Recent Labs Lab 11/01/14 0010  TROPONINI <0.03    CBG:  Recent Labs Lab 10/31/14 2010 10/31/14 2341  GLUCAP 153* 144*    Recent Results (from the past 240 hour(s))  MRSA PCR Screening     Status: None   Collection Time: 11/01/14 12:25 AM  Result Value Ref Range Status   MRSA by PCR NEGATIVE NEGATIVE Final    Comment:        The GeneXpert MRSA Assay (FDA approved for NASAL specimens only), is one component of a comprehensive MRSA colonization surveillance program. It is not intended to diagnose MRSA infection nor to guide or monitor treatment for MRSA infections.      Studies:   Recent x-ray studies have been reviewed in detail by the Attending Physician  Scheduled Meds:  Scheduled Meds: . aspirin EC  325 mg Oral Daily  . enoxaparin (LOVENOX) injection  40 mg Subcutaneous Q24H  . gemfibrozil  600 mg Oral BID AC  . glimepiride  1 mg Oral QPC supper  . insulin aspart  0-9 Units Subcutaneous TID WC  . losartan  100 mg Oral Daily  . omega-3 acid ethyl esters  1 g Oral BID    Time spent on care of this patient: 35 mins   Tyishia Aune T , MD   [facility] Office  [phone] Pager - Text Page per Shea Evans as per below:  On-Call/Text Page:      [email or web address]      password [email or web address]  If 7PM-7AM, please contact night-coverage [email or web address] Password [email or web address] 11/01/2014, 8:10 AM

## 2014-11-01 NOTE — Progress Notes (Signed)
Pt to CT

## 2014-11-01 NOTE — Progress Notes (Signed)
Pt returned from CT °

## 2014-11-01 NOTE — Progress Notes (Signed)
Pt has drank 1 and 1/4 containers of oral contrast. CT aware, plan to pick up pt at 0200. Will continue to monitor.

## 2014-11-02 DIAGNOSIS — R079 Chest pain, unspecified: Secondary | ICD-10-CM

## 2014-11-02 DIAGNOSIS — E119 Type 2 diabetes mellitus without complications: Secondary | ICD-10-CM

## 2014-11-02 DIAGNOSIS — E781 Pure hyperglyceridemia: Secondary | ICD-10-CM

## 2014-11-02 DIAGNOSIS — I10 Essential (primary) hypertension: Secondary | ICD-10-CM

## 2014-11-02 LAB — CBC
HEMATOCRIT: 44.5 % (ref 39.0–52.0)
Hemoglobin: 15.1 g/dL (ref 13.0–17.0)
MCH: 30.6 pg (ref 26.0–34.0)
MCHC: 33.9 g/dL (ref 30.0–36.0)
MCV: 90.3 fL (ref 78.0–100.0)
Platelets: 206 10*3/uL (ref 150–400)
RBC: 4.93 MIL/uL (ref 4.22–5.81)
RDW: 14.5 % (ref 11.5–15.5)
WBC: 12.2 10*3/uL — AB (ref 4.0–10.5)

## 2014-11-02 LAB — COMPREHENSIVE METABOLIC PANEL
ALBUMIN: 3.3 g/dL — AB (ref 3.5–5.0)
ALT: 48 U/L (ref 17–63)
ANION GAP: 9 (ref 5–15)
AST: 28 U/L (ref 15–41)
Alkaline Phosphatase: 78 U/L (ref 38–126)
BILIRUBIN TOTAL: 1.4 mg/dL — AB (ref 0.3–1.2)
BUN: 9 mg/dL (ref 6–20)
CHLORIDE: 105 mmol/L (ref 101–111)
CO2: 23 mmol/L (ref 22–32)
CREATININE: 0.96 mg/dL (ref 0.61–1.24)
Calcium: 8.4 mg/dL — ABNORMAL LOW (ref 8.9–10.3)
GFR calc Af Amer: >60 mL/min (ref >60)
GFR calc non Af Amer: >60 mL/min (ref >60)
Glucose, Bld: 140 mg/dL — ABNORMAL HIGH (ref 65–99)
Potassium: 3.6 mmol/L (ref 3.5–5.1)
Sodium: 137 mmol/L (ref 135–145)
Total Protein: 6.2 g/dL — ABNORMAL LOW (ref 6.5–8.1)

## 2014-11-02 LAB — GLUCOSE, CAPILLARY
GLUCOSE-CAPILLARY: 121 mg/dL — AB (ref 65–99)
Glucose-Capillary: 153 mg/dL — ABNORMAL HIGH (ref 65–99)
Glucose-Capillary: 104 mg/dL — ABNORMAL HIGH (ref 65–99)
Glucose-Capillary: 125 mg/dL — ABNORMAL HIGH (ref 65–99)

## 2014-11-02 LAB — LIPASE, BLOOD: LIPASE: 17 U/L — AB (ref 22–51)

## 2014-11-02 NOTE — Progress Notes (Signed)
[place] TEAM 1 - Stepdown/ICU TEAM Progress Note  Jeffrey Woodward YDX:[id number] DOB: 12/02/1950 DOA: 10/31/2014 PCP: Jani Gravel, MD  Admit HPI / Brief Narrative: Jeffrey Woodward is a 64 y.o.WM PMHx  DM Type 2, hypertension, hypertriglyceridemia presents to the ER because of sudden onset of right scapular pain with heartburn and nausea vomiting. Patient states at around 3 PM patient started developing right scapular pain which was radiating to his right chest. Eventually patient started having nausea and vomiting at least 3-4 episodes. Patient started having heartburn after the vomiting. In the ER patient had CT angiogram of the chest which was negative for anything acute except for chronic granulomatous changes. EKG was showing nonspecific changes in the inferior leads and troponin was negative. Labs show mildly elevated lipase. LFTs are pending. Patient's blood pressure also was found to be elevated and was having episodes of sinus bradycardia into the 40s. Patient will be admitted for further management. Patient still has mild right scapula pain. Patient only had one more episode of nausea vomiting after patient was given sublingual nitroglycerin. The abdomen on exam appears benign. Patient states he was diagnosed with hypertriglyceridemia and his triglycerides were as high as 1500.   HPI/Subjective: 5/31 A/O 4, negative CP, negative SOB. Patient states has had a previous workup at [facility] which showed that his high triglyceridemia had a familial component. States he has tried statin and niacin in the past but suffered side effects. States is also seen a nutritionist and the past to help with his cholesterol and diabetes and at one point his TG < 200. Believes his last hemoglobin A1c was 5+  Assessment/Plan: Right scapular pain with heartburn and nausea vomiting -Counseled patient his HEART Score=5 (giving him a 12-16% chance of MI, mortality), recommended highly that we complete his chest pain  workup. Agree to stay for echocardiogram.  -Currently CP/scapular pain free -Troponin 3 negative Cardiac enzymes -Lipase low -Continue aspirin 325 mg daily  Hypertension uncontrolled - BP now under control.  -Continue losartan 100 mg daily  Diabetes mellitus type 2 controlled  -Hemoglobin A1c pending -Continue sensitive SSI  Hypertriglyceridemia -Continue gemfibrozil 600 mg BID -Spoke at length diet. Patient has already spent and nutritionist.   Code Status: FULL Family Communication: no family present at time of exam Disposition Plan: Discharge in a.m. if echocardiogram normal    Consultants: NA  Procedure/Significant Events:    Culture   Antibiotics:   DVT prophylaxis: Lovenox   Devices    LINES / TUBES:      Continuous Infusions: . sodium chloride 50 mL/hr at 11/02/14 0700    Objective: VITAL SIGNS: Temp: 98.7 F (37.1 C) (05/31 1202) Temp Source: Oral (05/31 1202) BP: 109/66 mmHg (05/31 1202) Pulse Rate: 72 (05/31 1202) SPO2; FIO2:   Intake/Output Summary (Last 24 hours) at 11/02/14 1732 Last data filed at 11/02/14 1100  Gross per 24 hour  Intake    170 ml  Output   1625 ml  Net  -1455 ml     Exam: General:  A/O 4, NAD, No acute respiratory distress Eyes: Negative headache, eye pain, double vision, negative retinal hemorrhage ENT: Negative Runny nose, negative ear pain, negative tinnitus, negative gingival bleeding Neck:  Negative scars, masses, torticollis, lymphadenopathy, JVD Lungs: Clear to auscultation bilaterally without wheezes or crackles Cardiovascular: Regular rate and rhythm without murmur gallop or rub normal S1 and S2 Abdomen:negative abdominal pain, negative dysphagia, Nontender, nondistended, soft, bowel sounds positive, no rebound, no ascites, no appreciable mass  Extremities: No significant cyanosis, clubbing, or edema bilateral lower extremities Neurologic:  Cranial nerves II through XII intact, tongue/uvula  midline, all extremities muscle strength 5/5, sensation intact throughout, negative dysarthria, negative expressive aphasia, negative receptive aphasia.    Data Reviewed: Basic Metabolic Panel:  Recent Labs Lab 10/31/14 1828 11/01/14 0805 11/02/14 0230  NA 135 137 137  K 4.1 3.6 3.6  CL 103 104 105  CO2 20* 23 23  GLUCOSE 169* 153* 140*  BUN 17 14 9   CREATININE 1.10 0.97 0.96  CALCIUM 8.9 8.7* 8.4*   Liver Function Tests:  Recent Labs Lab 11/01/14 0805 11/02/14 0230  AST 31 28  ALT 56 48  ALKPHOS 86 78  BILITOT 1.0 1.4*  PROT 7.1 6.2*  ALBUMIN 3.7 3.3*    Recent Labs Lab 10/31/14 1828 11/01/14 0805 11/02/14 0230  LIPASE 67* 19* 17*   No results for input(s): AMMONIA in the last 168 hours. CBC:  Recent Labs Lab 10/31/14 1828 11/01/14 0613 11/02/14 0230  WBC 10.1 17.8* 12.2*  NEUTROABS  --  14.9*  --   HGB 16.7 16.0 15.1  HCT 46.5 45.1 44.5  MCV 87.6 88.4 90.3  PLT 243 243 206   Cardiac Enzymes:  Recent Labs Lab 11/01/14 0010 11/01/14 0613 11/01/14 1705  TROPONINI <0.03 <0.03 <0.03   BNP (last 3 results)  Recent Labs  10/31/14 1828  BNP 12.2    ProBNP (last 3 results) No results for input(s): PROBNP in the last 8760 hours.  CBG:  Recent Labs Lab 11/01/14 1603 11/01/14 2116 11/02/14 0749 11/02/14 1202 11/02/14 1548  GLUCAP 178* 133* 121* 153* 104*    Recent Results (from the past 240 hour(s))  MRSA PCR Screening     Status: None   Collection Time: 11/01/14 12:25 AM  Result Value Ref Range Status   MRSA by PCR NEGATIVE NEGATIVE Final    Comment:        The GeneXpert MRSA Assay (FDA approved for NASAL specimens only), is one component of a comprehensive MRSA colonization surveillance program. It is not intended to diagnose MRSA infection nor to guide or monitor treatment for MRSA infections.      Studies:  Recent x-ray studies have been reviewed in detail by the Attending Physician  Scheduled Meds:  Scheduled  Meds: . aspirin EC  325 mg Oral Daily  . enoxaparin (LOVENOX) injection  40 mg Subcutaneous Q24H  . gemfibrozil  600 mg Oral BID AC  . insulin aspart  0-9 Units Subcutaneous TID WC  . losartan  100 mg Oral Daily  . omega-3 acid ethyl esters  1 g Oral BID    Time spent on care of this patient: 40 mins   Quaneshia Wareing, Geraldo Docker , MD  [facility] Office  [phone] Pager - [phone]  On-Call/Text Page:      [email or web address]      password [email or web address]  If 7PM-7AM, please contact night-coverage [email or web address] Password [email or web address] 11/02/2014, 5:32 PM   LOS: 1 day   Care during the described time interval was provided by me .  I have reviewed this patient's available data, including medical history, events of note, physical examination, and all test results as part of my evaluation. I have personally reviewed and interpreted all radiology studies.   Dia Crawford, MD [phone] Pager

## 2014-11-03 ENCOUNTER — Inpatient Hospital Stay (HOSPITAL_COMMUNITY): Payer: BLUE CROSS/BLUE SHIELD

## 2014-11-03 DIAGNOSIS — K859 Acute pancreatitis, unspecified: Principal | ICD-10-CM

## 2014-11-03 DIAGNOSIS — R0789 Other chest pain: Secondary | ICD-10-CM

## 2014-11-03 LAB — GLUCOSE, CAPILLARY
GLUCOSE-CAPILLARY: 120 mg/dL — AB (ref 65–99)
GLUCOSE-CAPILLARY: 117 mg/dL — AB (ref 65–99)

## 2014-11-03 MED ORDER — GLIMEPIRIDE 1 MG PO TABS: 1.0000 mg | ORAL_TABLET | Freq: Every day | ORAL | Status: DC

## 2014-11-03 NOTE — Discharge Instructions (Signed)

## 2014-11-03 NOTE — Progress Notes (Signed)
Dicussed AVS with pt and wife, both verbalize understanding. Notified CCMD, monitor and IVs removed. All personal belongings with pt. Discharged to home.

## 2014-11-03 NOTE — Progress Notes (Signed)
Echocardiogram 2D Echocardiogram has been performed.  Jeffrey Woodward 11/03/2014, 11:43 AM

## 2014-11-03 NOTE — Discharge Summary (Signed)
DISCHARGE SUMMARY  Jeffrey Woodward  MR#: [id number]  DOB:19-Aug-1950  Date of Admission: 10/31/2014 Date of Discharge: 11/03/2014  Attending Physician:Alias Villagran T  Patient's PCP:KIM, Jeneen Rinks, MD  Consults:  none  Disposition: D/C home   Follow-up Appts:     Follow-up Information    Follow up with Jani Gravel, MD In 2 weeks.   Specialty:  Internal Medicine   Contact information:   [address] [place] [address] [phone]      Tests Needing Follow-up: -routine f/u of his hepatic steatosis is indicated -routine f/u of his DM control is indicated -routine f/u of his obesity is indicated -routine f;u of his triglyceride control is indicated   Discharge Diagnoses: Right scapular pain Heartburn w/ nausea / vomiting - pancreatitis  Metabolic acidosis  Hypertriglyceridemia - HLD Uncontrolled HTN DM Morbid Obesity - Body mass index is 34.26 kg/(m^2).  Hepatic steatosis   Initial presentation: 64 y.o. male with history of diabetes mellitus, hypertension, and hypertriglyceridemia who presented to the ER because of sudden onset of right scapular pain with heartburn and nausea / vomiting.   In the ER CT angiogram of the chest noted only chronic granulomatous changes. EKG noted nonspecific changes in the inferior leads and troponin was negative. Labs show mildly elevated lipase. Patient's blood pressure was elevated and he was having episodes of sinus bradycardia into the 40s.  Hospital Course:  Right scapular pain suspect this was referred pain due to pancreatitis - serial cardiac enzymes normal - EKG without acute changes - TTE with preserved systolic function with no focal wall motion abnormalities - pain resolved at time of discharge  Heartburn w/ nausea / vomiting - pancreatitis  CT abdomen unrevealing but lipase elevated suggesting pancreatitis - likely related to severe hypertriglyceridemia but can't rule out passing of a gallstone - LFTs  normal arguing against impacted gallstone but they were not checked at his presentation - lipid panel confirmed known severe hypertriglyceridemia - symptoms improved rapidly with restriction of diet and patient is again tolerating low-fat heart healthy diet at the time of his discharge - counseled patient extensively on the need for weight loss and triglyceride control  Metabolic acidosis  Resolved  Hypertriglyceridemia - HLD This is a long-standing known issue - the patient reports he has been referred to [facility] in the past and has tried multiple different combination therapies - lipid panel confirms significant hypertriglyceridemia - he is advised that weight loss should also be an important part of his treatment plan  Uncontrolled HTN BP well controlled with resolution of pain  DM CBG well controlled throughout hospital stay - A1c pending at time of discharge - no change in diabetes treatment plan at this time  Obesity - Body mass index is 34.26 kg/(m^2).  As noted above patient has been advised that weight loss is important part of his treatment plan  Hepatic steatosis  Noted on [place] as early as Jan 2015 - discussed need for weight loss to improve this finding and the possibility of long-term liver damage if this persists for years    Medication List    TAKE these medications        aspirin EC 81 MG tablet  Take 81 mg by mouth daily.     Fish Oil 1000 MG Caps  Take 1,000 mg by mouth 2 (two) times daily.     gemfibrozil 600 MG tablet  Commonly known as:  LOPID  Take 600 mg by mouth 2 (two) times daily before a  meal.     glimepiride 1 MG tablet  Commonly known as:  AMARYL  Take 1 tablet (1 mg total) by mouth daily with breakfast.     losartan 100 MG tablet  Commonly known as:  COZAAR  Take 100 mg by mouth daily.       Day of Discharge BP 112/61 mmHg  Pulse 58  Temp(Src) 98.5 F (36.9 C) (Oral)  Resp 11  Ht 5\' 10"  (1.778 m)  Wt 108.3 kg (238 lb 12.1 oz)  BMI 34.26  kg/m2  SpO2 97%  Physical Exam: General: No acute respiratory distress Lungs: Clear to auscultation bilaterally without wheezes or crackles Cardiovascular: Regular rate and rhythm without murmur gallop or rub normal S1 and S2 Abdomen: Nontender, obese, soft, bowel sounds positive, no rebound, no ascites, no appreciable mass Extremities: No significant cyanosis, clubbing, or edema bilateral lower extremities  Basic Metabolic Panel:  Recent Labs Lab 10/31/14 1828 11/01/14 0805 11/02/14 0230  NA 135 137 137  K 4.1 3.6 3.6  CL 103 104 105  CO2 20* 23 23  GLUCOSE 169* 153* 140*  BUN 17 14 9   CREATININE 1.10 0.97 0.96  CALCIUM 8.9 8.7* 8.4*    Liver Function Tests:  Recent Labs Lab 11/01/14 0805 11/02/14 0230  AST 31 28  ALT 56 48  ALKPHOS 86 78  BILITOT 1.0 1.4*  PROT 7.1 6.2*  ALBUMIN 3.7 3.3*    Recent Labs Lab 10/31/14 1828 11/01/14 0805 11/02/14 0230  LIPASE 67* 19* 17*   CBC:  Recent Labs Lab 10/31/14 1828 11/01/14 0613 11/02/14 0230  WBC 10.1 17.8* 12.2*  NEUTROABS  --  14.9*  --   HGB 16.7 16.0 15.1  HCT 46.5 45.1 44.5  MCV 87.6 88.4 90.3  PLT 243 243 206    Cardiac Enzymes:  Recent Labs Lab 11/01/14 0010 11/01/14 0613 11/01/14 1705  TROPONINI <0.03 <0.03 <0.03    CBG:  Recent Labs Lab 11/02/14 1202 11/02/14 1548 11/02/14 2116 11/03/14 0745 11/03/14 1143  GLUCAP 153* 104* 125* 120* 117*    Recent Results (from the past 240 hour(s))  MRSA PCR Screening     Status: None   Collection Time: 11/01/14 12:25 AM  Result Value Ref Range Status   MRSA by PCR NEGATIVE NEGATIVE Final    Comment:        The GeneXpert MRSA Assay (FDA approved for NASAL specimens only), is one component of a comprehensive MRSA colonization surveillance program. It is not intended to diagnose MRSA infection nor to guide or monitor treatment for MRSA infections.       Time spent in discharge (includes decision making & examination of pt): >30  minutes  11/03/2014, 2:25 PM   Cherene Altes, MD [facility] Office  [phone] Pager [phone]  On-Call/Text Page:      [email or web address]      password [email or web address]

## 2014-11-04 LAB — HEMOGLOBIN A1C
Hgb A1c MFr Bld: 6.1 % — ABNORMAL HIGH (ref 4.8–5.6)
Mean Plasma Glucose: 128 mg/dL

## 2015-01-06 DIAGNOSIS — R635 Abnormal weight gain: Secondary | ICD-10-CM | POA: Diagnosis not present

## 2015-01-06 DIAGNOSIS — I10 Essential (primary) hypertension: Secondary | ICD-10-CM | POA: Diagnosis not present

## 2015-02-01 DIAGNOSIS — E780 Pure hypercholesterolemia: Secondary | ICD-10-CM | POA: Diagnosis not present

## 2015-02-01 DIAGNOSIS — I10 Essential (primary) hypertension: Secondary | ICD-10-CM | POA: Diagnosis not present

## 2015-02-01 DIAGNOSIS — K859 Acute pancreatitis, unspecified: Secondary | ICD-10-CM | POA: Diagnosis not present

## 2015-02-01 DIAGNOSIS — R739 Hyperglycemia, unspecified: Secondary | ICD-10-CM | POA: Diagnosis not present

## 2015-02-14 DIAGNOSIS — R933 Abnormal findings on diagnostic imaging of other parts of digestive tract: Secondary | ICD-10-CM | POA: Diagnosis not present

## 2015-02-14 DIAGNOSIS — I10 Essential (primary) hypertension: Secondary | ICD-10-CM | POA: Diagnosis not present

## 2015-02-14 DIAGNOSIS — E781 Pure hyperglyceridemia: Secondary | ICD-10-CM | POA: Diagnosis not present

## 2015-06-08 DIAGNOSIS — Z23 Encounter for immunization: Secondary | ICD-10-CM | POA: Diagnosis not present

## 2015-10-05 DIAGNOSIS — I10 Essential (primary) hypertension: Secondary | ICD-10-CM | POA: Diagnosis not present

## 2015-10-05 DIAGNOSIS — K7689 Other specified diseases of liver: Secondary | ICD-10-CM | POA: Diagnosis not present

## 2015-10-05 DIAGNOSIS — R739 Hyperglycemia, unspecified: Secondary | ICD-10-CM | POA: Diagnosis not present

## 2015-10-05 DIAGNOSIS — E7800 Pure hypercholesterolemia, unspecified: Secondary | ICD-10-CM | POA: Diagnosis not present

## 2016-02-02 DIAGNOSIS — I10 Essential (primary) hypertension: Secondary | ICD-10-CM | POA: Diagnosis not present

## 2016-02-08 DIAGNOSIS — K7689 Other specified diseases of liver: Secondary | ICD-10-CM | POA: Diagnosis not present

## 2016-02-08 DIAGNOSIS — R739 Hyperglycemia, unspecified: Secondary | ICD-10-CM | POA: Diagnosis not present

## 2016-02-08 DIAGNOSIS — E7800 Pure hypercholesterolemia, unspecified: Secondary | ICD-10-CM | POA: Diagnosis not present

## 2016-02-08 DIAGNOSIS — I10 Essential (primary) hypertension: Secondary | ICD-10-CM | POA: Diagnosis not present

## 2016-03-05 IMAGING — CT CT ANGIO CHEST
2 of 9 series · 18 of 46 positions shown · IV contrast (Omni 300)
Comparison: None.

CLINICAL DATA: Right-sided chest pain

EXAM:
CT ANGIOGRAPHY CHEST WITH CONTRAST
TECHNIQUE: Multidetector CT imaging of the chest was performed using the
standard protocol during bolus administration of intravenous
contrast. Multiplanar CT image reconstructions and MIPs were
obtained to evaluate the vascular anatomy.
CONTRAST:  100mL OMNIPAQUE IOHEXOL 350 MG/ML SOLN

[Series 5: thins · axial · 0.81mm/px · z∈[-724,-482]mm · 15 of 274 slices shown]
[im 16/274  lung]
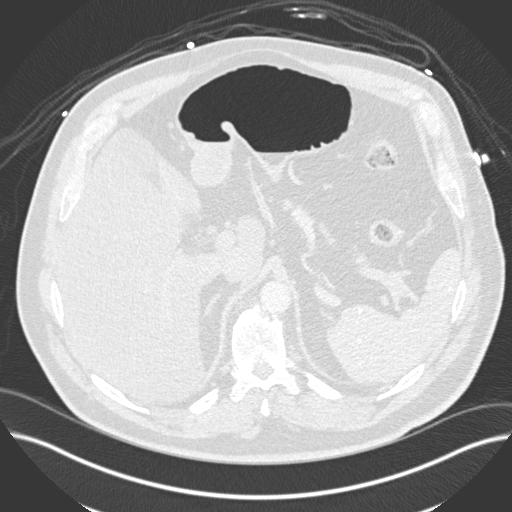
[im 31/274  soft-tissue]
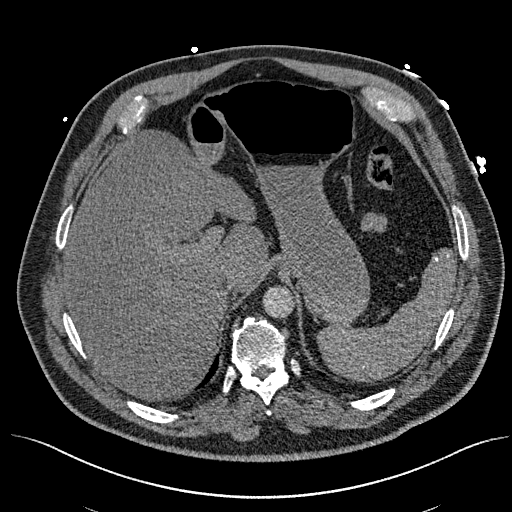
[im 46/274  lung]
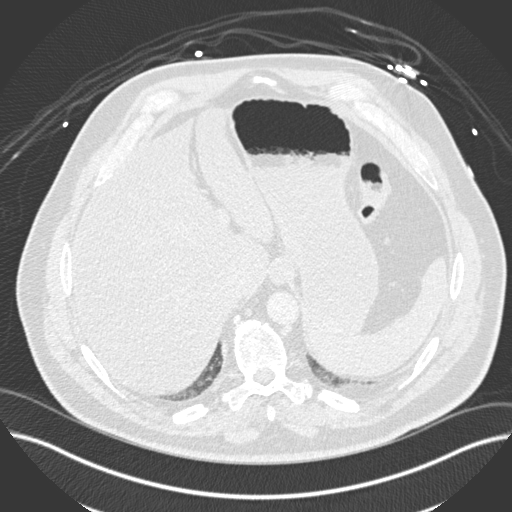
[im 61/274  soft-tissue]
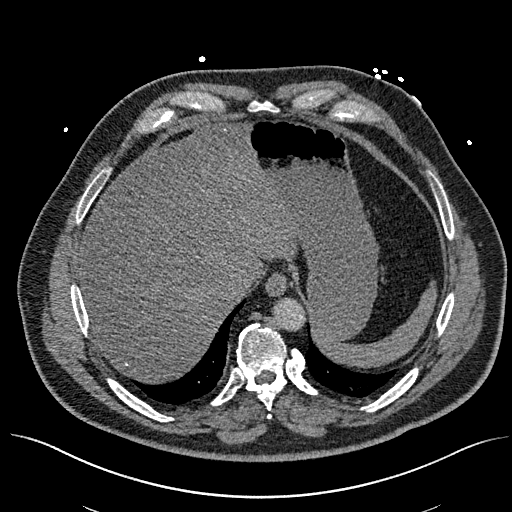
[im 92/274  lung]
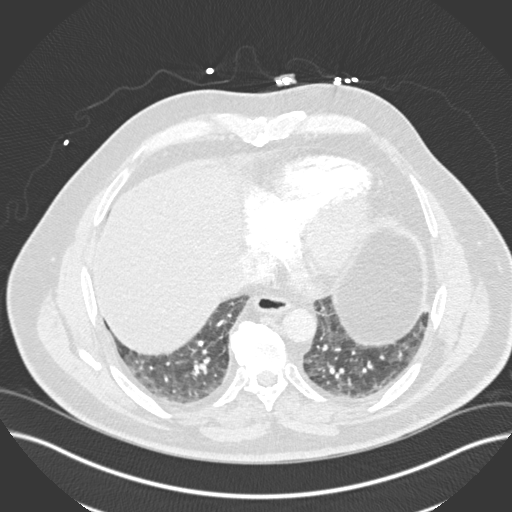
[im 107/274  soft-tissue]
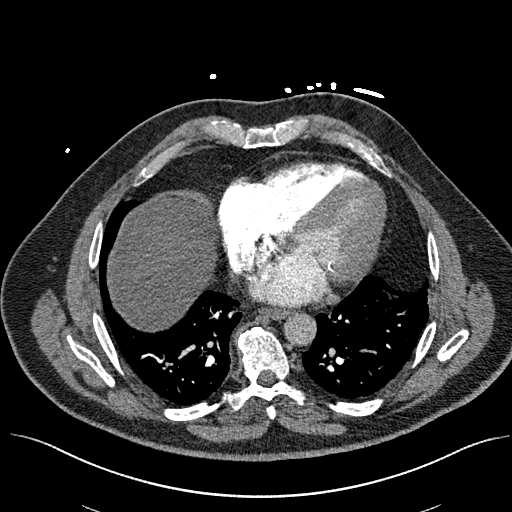
[im 122/274  lung]
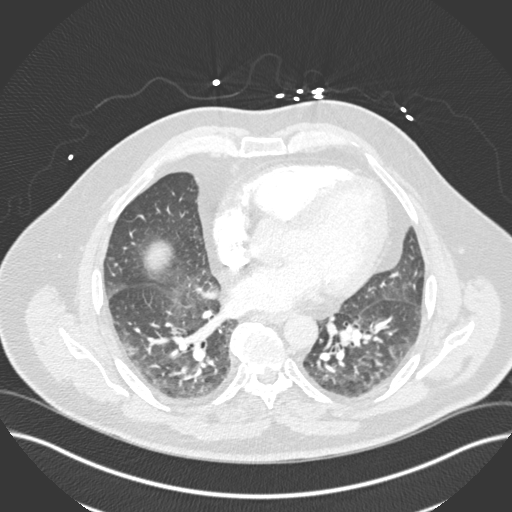
[im 137/274  soft-tissue]
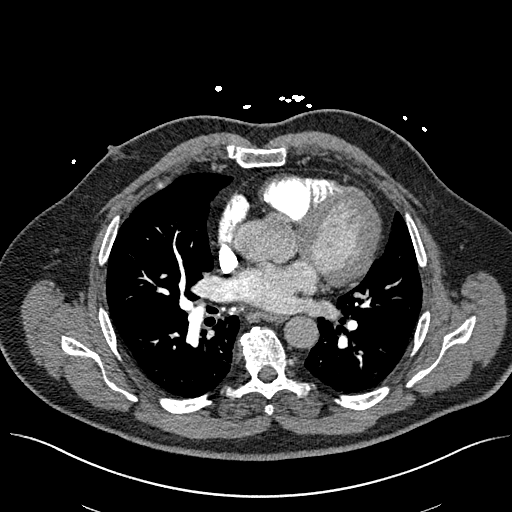
[im 152/274  lung]
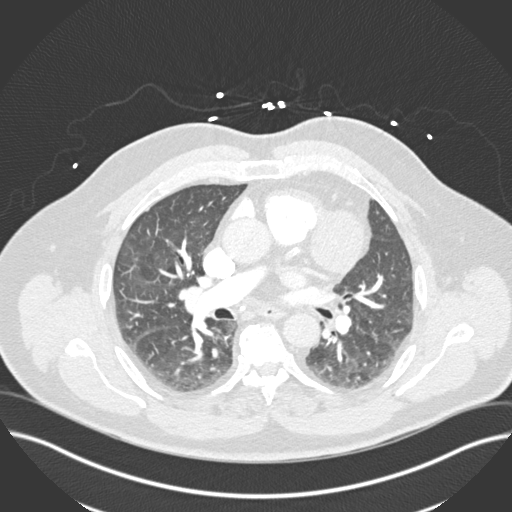
[im 167/274  soft-tissue]
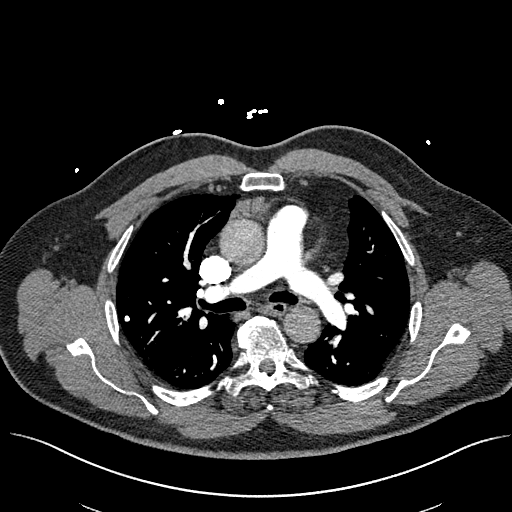
[im 183/274  lung]
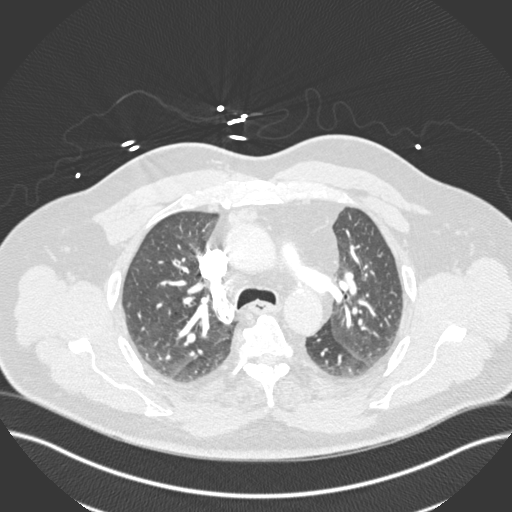
[im 213/274  soft-tissue]
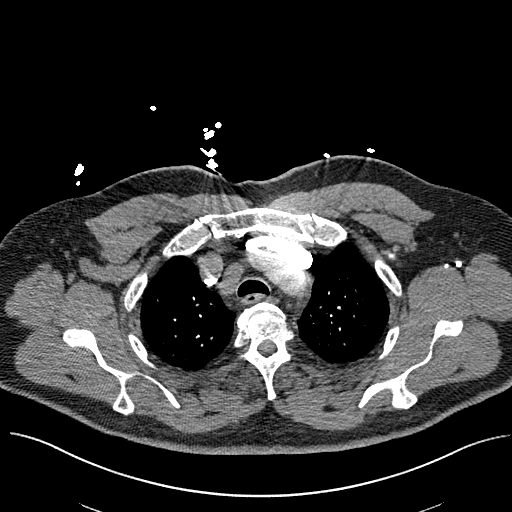
[im 228/274  lung]
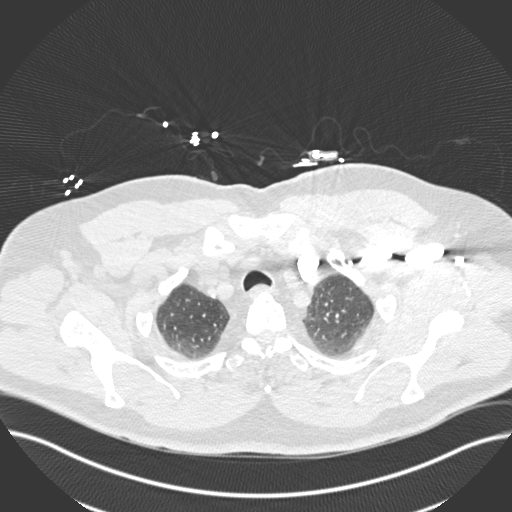
[im 243/274  soft-tissue]
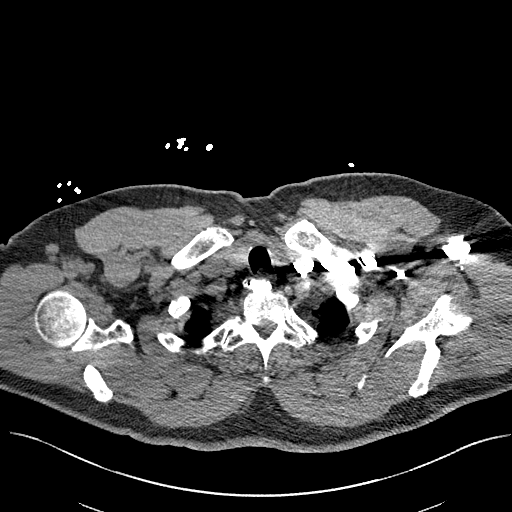
[im 258/274  lung]
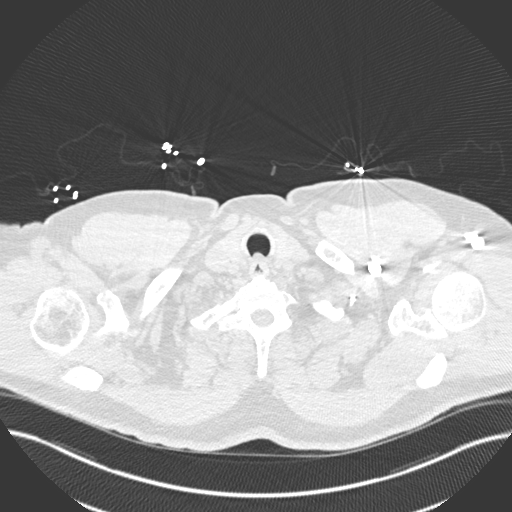

[Series 7: coronal mpr · coronal · 0.59mm/px · 3 of 155 slices shown]
[im 39/155  soft-tissue]
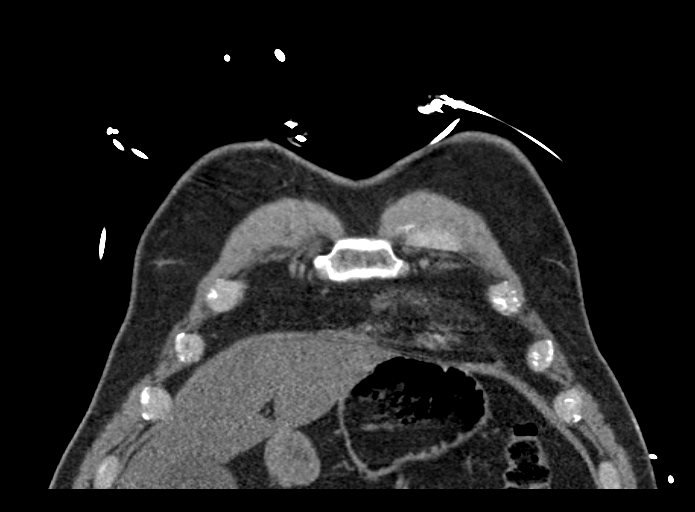
[im 78/155  soft-tissue]
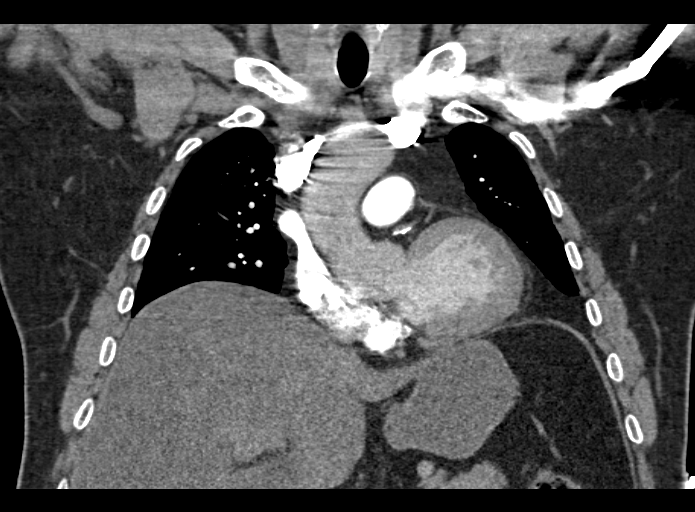
[im 116/155  soft-tissue]
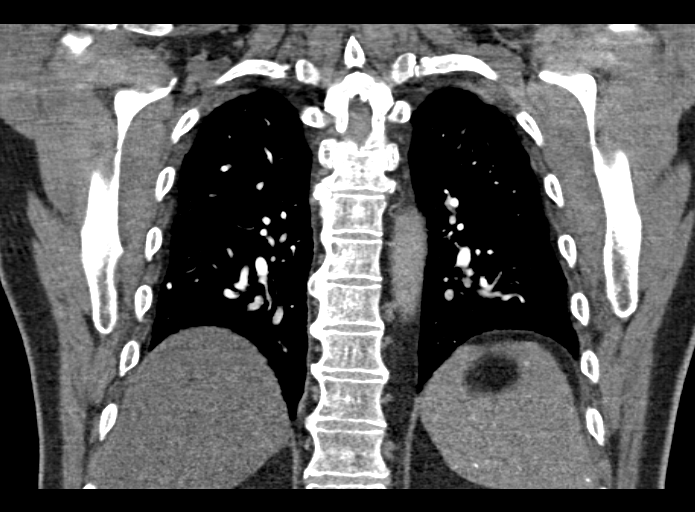

[18 of 46 positions shown; findings below may reference images not displayed]

FINDINGS: The lungs are well aerated bilaterally. Calcified granulomas are
noted which correspond to the nodule seen on recent chest x-ray. No
sizable infiltrate or effusion is seen.

The thoracic inlet is within normal limits. The thoracic aorta as
visualized is unremarkable although the contrast opacification is
limited. Multiple calcified hilar and mediastinal lymph nodes are
noted consistent with prior granulomas disease. Pulmonary artery
demonstrates a normal branching pattern. No filling defects to
suggest pulmonary emboli are identified. The visualized upper
abdomen shows multiple splenic granulomas. Mild coronary
calcifications are seen. The osseous structures show no acute
abnormality. Degenerative changes of the thoracic spine are seen.

Review of the MIP images confirms the above findings.
IMPRESSION: No evidence of pulmonary emboli.

Changes consistent with prior granulomatous disease.

## 2016-08-10 DIAGNOSIS — I10 Essential (primary) hypertension: Secondary | ICD-10-CM | POA: Diagnosis not present

## 2016-08-10 DIAGNOSIS — I2510 Atherosclerotic heart disease of native coronary artery without angina pectoris: Secondary | ICD-10-CM | POA: Diagnosis not present

## 2016-08-10 DIAGNOSIS — R0602 Shortness of breath: Secondary | ICD-10-CM | POA: Diagnosis not present

## 2016-08-23 DIAGNOSIS — E782 Mixed hyperlipidemia: Secondary | ICD-10-CM | POA: Diagnosis not present

## 2016-08-23 DIAGNOSIS — R0609 Other forms of dyspnea: Secondary | ICD-10-CM | POA: Diagnosis not present

## 2016-08-23 DIAGNOSIS — I2510 Atherosclerotic heart disease of native coronary artery without angina pectoris: Secondary | ICD-10-CM | POA: Diagnosis not present

## 2016-08-23 DIAGNOSIS — I10 Essential (primary) hypertension: Secondary | ICD-10-CM | POA: Diagnosis not present

## 2016-09-27 DIAGNOSIS — R739 Hyperglycemia, unspecified: Secondary | ICD-10-CM | POA: Diagnosis not present

## 2016-09-27 DIAGNOSIS — E7800 Pure hypercholesterolemia, unspecified: Secondary | ICD-10-CM | POA: Diagnosis not present

## 2016-09-27 DIAGNOSIS — I10 Essential (primary) hypertension: Secondary | ICD-10-CM | POA: Diagnosis not present

## 2016-09-27 DIAGNOSIS — Z0000 Encounter for general adult medical examination without abnormal findings: Secondary | ICD-10-CM | POA: Diagnosis not present

## 2016-10-17 DIAGNOSIS — B354 Tinea corporis: Secondary | ICD-10-CM | POA: Diagnosis not present

## 2016-10-24 DIAGNOSIS — I10 Essential (primary) hypertension: Secondary | ICD-10-CM | POA: Diagnosis not present

## 2016-10-24 DIAGNOSIS — E782 Mixed hyperlipidemia: Secondary | ICD-10-CM | POA: Diagnosis not present

## 2016-10-24 DIAGNOSIS — R0609 Other forms of dyspnea: Secondary | ICD-10-CM | POA: Diagnosis not present

## 2016-10-24 DIAGNOSIS — I2510 Atherosclerotic heart disease of native coronary artery without angina pectoris: Secondary | ICD-10-CM | POA: Diagnosis not present

## 2017-01-28 DIAGNOSIS — R739 Hyperglycemia, unspecified: Secondary | ICD-10-CM | POA: Diagnosis not present

## 2017-01-28 DIAGNOSIS — E7800 Pure hypercholesterolemia, unspecified: Secondary | ICD-10-CM | POA: Diagnosis not present

## 2017-01-28 DIAGNOSIS — I10 Essential (primary) hypertension: Secondary | ICD-10-CM | POA: Diagnosis not present

## 2017-01-28 DIAGNOSIS — Z125 Encounter for screening for malignant neoplasm of prostate: Secondary | ICD-10-CM | POA: Diagnosis not present

## 2017-03-28 DIAGNOSIS — Z23 Encounter for immunization: Secondary | ICD-10-CM | POA: Diagnosis not present

## 2017-07-23 DIAGNOSIS — I10 Essential (primary) hypertension: Secondary | ICD-10-CM | POA: Diagnosis not present

## 2017-07-23 DIAGNOSIS — Z125 Encounter for screening for malignant neoplasm of prostate: Secondary | ICD-10-CM | POA: Diagnosis not present

## 2017-07-23 DIAGNOSIS — R739 Hyperglycemia, unspecified: Secondary | ICD-10-CM | POA: Diagnosis not present

## 2017-07-30 DIAGNOSIS — Z0000 Encounter for general adult medical examination without abnormal findings: Secondary | ICD-10-CM | POA: Diagnosis not present

## 2017-07-30 DIAGNOSIS — E7800 Pure hypercholesterolemia, unspecified: Secondary | ICD-10-CM | POA: Diagnosis not present

## 2017-07-30 DIAGNOSIS — R0681 Apnea, not elsewhere classified: Secondary | ICD-10-CM | POA: Diagnosis not present

## 2017-09-02 ENCOUNTER — Encounter: Payer: Self-pay | Admitting: Neurology

## 2017-09-02 DIAGNOSIS — B354 Tinea corporis: Secondary | ICD-10-CM | POA: Diagnosis not present

## 2017-09-03 ENCOUNTER — Encounter: Payer: Self-pay | Admitting: Neurology

## 2017-09-03 ENCOUNTER — Ambulatory Visit (INDEPENDENT_AMBULATORY_CARE_PROVIDER_SITE_OTHER): Payer: Medicare Other | Admitting: Neurology

## 2017-09-03 VITALS — BP 135/87 | HR 62 | Ht 71.0 in | Wt 222.0 lb

## 2017-09-03 DIAGNOSIS — G14 Postpolio syndrome: Secondary | ICD-10-CM | POA: Diagnosis not present

## 2017-09-03 DIAGNOSIS — R0681 Apnea, not elsewhere classified: Secondary | ICD-10-CM | POA: Diagnosis not present

## 2017-09-03 DIAGNOSIS — R0683 Snoring: Secondary | ICD-10-CM | POA: Diagnosis not present

## 2017-09-03 HISTORY — DX: Snoring: R06.83

## 2017-09-03 NOTE — Progress Notes (Signed)
SLEEP MEDICINE CLINIC   Provider:  Larey Seat, M D  Primary Care Physician:  Jani Gravel, MD   Referring Provider: Jani Gravel, MD    Chief Complaint  Patient presents with  . New Patient (Initial Visit)    pt alone, rm 10. pt was told by his wife that he snores and stops breathing in his sleep. he doesnt concerns. pt states that he has never had a sleep study before.    HPI:  Jeffrey Woodward is a 67 y.o. male , seen here in a referral  from Dr. Maudie Mercury for evaluation of sleep apnea.   Jeffrey Woodward is a 67 year old Caucasian gentleman who had polio and walks with the help of a cane.   He is referred for a sleep study after his wife had reported that he snores.  He has a past medical history of hypertension, hyperlipidemia, metabolic syndrome with obesity, postpolio syndrome, sciatica with back pain, ED, was diagnosed with a fatty liver in January 2015 and pancreatitis in June 2016, cholelithiasis, hernia.   He had back surgery with Dr. Sherwood Gambler left knee surgery at [facility], right knee arthroscopy by Dr. Irving Shows, he has a reconstructed food but after polio  affecting his left lower extremity - evident at age 32. 19 years, and surgeries followed in 1952, 1955 and 36 in [place].   Chief complaint according to patient : "I may snore "  Sleep habits are as follows: Jeffrey Woodward usually watches the late night news until 1130 before he retires to his bedroom, where he does not watch TV.  The bedroom is cool, quiet and dark.  He sleeps in a nonadjustable bed on his left side, and on one pillow.  He shares the bed with his wife.  He usually does not have trouble to go to sleep or to stay asleep.  She has noticed him snoring and sometimes breathing irregularly.  He has not woken up choking or feeling air hungry. Jeffrey Woodward reports sound sleep, as his wife frequently leaves the bed and has to get up at night and he is not woken by her activity. He rarely has  nocturia and if only because he drinks more fluid late at night before going to bed.  He does not recall dreaming a lot, and he usually does not enact dreams. He wakes up at 6.30 and gets up at that time- this was his work time routine. No naps.   Sleep medical history and family sleep history: Younger sister has been diagnosed with apnea and is wearing a CPAP.  He is not aware of his parents or other family members having a sleep disorder.  He was not a sleep walker and did not suffer from night terrors as a child.  His childhood history has been dominated by the diagnosis of polio which has led to him not developing his left lower extremity as well, and he had reconstructive surgery with muscle transposition from the gastrocnemius to  the foot.  These transposition surgeries were done in 3 episodes, the latest when he was in forth grade .   Social history: retired Personal assistant, originally from [place], later lived in [place].    Review of Systems: Out of a complete 14 system review, the patient complains of only the following symptoms, and all other reviewed systems are negative. snoring  Epworth score 6 , Fatigue severity score 23  , depression score 2/ 15    Social History  Socioeconomic History  . Marital status: Married    Spouse name: Not on file  . Number of children: Not on file  . Years of education: Not on file  . Highest education level: Not on file  Occupational History  . Not on file  Social Needs  . Financial resource strain: Not on file  . Food insecurity:    Worry: Not on file    Inability: Not on file  . Transportation needs:    Medical: Not on file    Non-medical: Not on file  Tobacco Use  . Smoking status: Never Smoker  . Smokeless tobacco: Never Used  Substance and Sexual Activity  . Alcohol use: No  . Drug use: No  . Sexual activity: Not on file  Lifestyle  . Physical activity:    Days per week: Not on file    Minutes per session: Not on file  . Stress:  Not on file  Relationships  . Social connections:    Talks on phone: Not on file    Gets together: Not on file    Attends religious service: Not on file    Active member of club or organization: Not on file    Attends meetings of clubs or organizations: Not on file    Relationship status: Not on file  . Intimate partner violence:    Fear of current or ex partner: Not on file    Emotionally abused: Not on file    Physically abused: Not on file    Forced sexual activity: Not on file  Other Topics Concern  . Not on file  Social History Narrative  . Not on file    Family History  Problem Relation Age of Onset  . CAD Mother   . Diabetes Mellitus II Mother   . CAD Father   . Stroke Father   . Diabetes Mellitus II Father     Past Medical History:  Diagnosis Date  . Diabetes mellitus without complication ([place])   . Hyperlipidemia   . Hypertension     Past Surgical History:  Procedure Laterality Date  . BACK SURGERY    . COLONOSCOPY    . KNEE ARTHROSCOPY Right   . KNEE SURGERY Left   . leg reconstructive surgery Left    left leg had polio had reconstructive surgery on LEFT FOOT [phone]    Current Outpatient Medications  Medication Sig Dispense Refill  . fenofibrate 160 MG tablet Take 160 mg by mouth daily.    Marland Kitchen losartan (COZAAR) 100 MG tablet Take 100 mg by mouth daily.   5  . Omega-3 Fatty Acids (FISH OIL) 1000 MG CAPS Take 1,000 mg by mouth 2 (two) times daily.     No current facility-administered medications for this visit.     Allergies as of 09/03/2017  . (No Known Allergies)    Vitals: BP 135/87   Pulse 62   Ht 5\' 11"  (1.803 m)   Wt 222 lb (100.7 kg)   BMI 30.96 kg/m  Last Weight:  Wt Readings from Last 1 Encounters:  09/03/17 222 lb (100.7 kg)   KGU:RKYH mass index is 30.96 kg/m.     Last Height:   Ht Readings from Last 1 Encounters:  09/03/17 5\' 11"  (1.803 m)    Physical exam:  General: The patient is awake, alert and appears not in  acute distress. The patient is well groomed. Head: Normocephalic, atraumatic. Neck is supple. Mallampati 4 ,  neck circumference:17.5. Nasal airflow patent .  Retrognathia is seen.  Cardiovascular:  Regular rate and rhythm , without  murmurs or carotid bruit, and without distended neck veins. Respiratory: Lungs are clear to auscultation. Skin:  Without evidence of edema, or rash Trunk: BMI is 31. The patient's posture is erect.   Neurologic exam : The patient is awake and alert, oriented to place and time.   Attention span & concentration ability appears normal.  Speech is fluent,  without dysarthria, dysphonia or aphasia.  Mood and affect are appropriate.  Cranial nerves: Anosmia- traumatic 1998. Taste is preserved. Pupils are unequal but briskly reactive to light. Right pupil is larger and there is mild ptosis on the right.   Funduscopic exam without evidence of pallor or edema. Extraocular movements  in horizontal planes intact- left eye does not move as much upwards -  without nystagmus. Visual fields by finger perimetry are intact. Hearing to finger rub intact.  Facial sensation intact to fine touch. Facial motor strength is symmetric and tongue and uvula move midline. Shoulder shrug was symmetrical.   Motor exam: Normal tone, muscle bulk and symmetric strength in upper extremities.  Sensory:  Fine touch, pinprick and vibration were tested  normal , I did not examine he lower extremities. He has a drop foot. .  Coordination: Rapid alternating movements in the fingers/hands was normal. Finger-to-nose maneuver  normal without evidence of ataxia, dysmetria or tremor.  Gait and station: Patient walks with a cane for  assistive device Deep tendon reflexes: in the  upper  extremities are symmetric and intact.   Assessment:  After physical and neurologic examination, review of laboratory studies,  Personal review of imaging studies, reports of other /same  Imaging studies, results of  polysomnography and / or neurophysiology testing and pre-existing records as far as provided in visit., my assessment is   1)   Jeffrey Woodward is not excessively daytime sleepy or fatigued, but his wife has noticed him to snore and to breathes irregular, having timed pauses over 20 seconds in his breathing.  He also has a strong family history of cardiovascular disease.  I do not think that his history of polio in infancy would affect him in terms of his coronary artery function arrhythmia or cardiomyopathy risk.  I will order a sleep study for the patient- he will sleep well in a lab, he thinks.    The patient was advised of the nature of the diagnosed disorder , the treatment options and the  risks for general health and wellness arising from not treating the condition.   I spent more than 45 minutes of face to face time with the patient.  Greater than 50% of time was spent in counseling and coordination of care. We have discussed the diagnosis and differential and I answered the patient's questions.    Plan:  Treatment plan and additional workup :  SPLIT night PSG. Rv with MD or NP.    Larey Seat, MD 7/0/2637, 85:88 AM  Certified in Neurology by ABPN Certified in [place] by [facility] [address], [place], [place] [address]

## 2017-09-03 NOTE — Patient Instructions (Signed)

## 2017-10-02 ENCOUNTER — Ambulatory Visit (INDEPENDENT_AMBULATORY_CARE_PROVIDER_SITE_OTHER): Payer: Medicare Other | Admitting: Neurology

## 2017-10-02 DIAGNOSIS — R0681 Apnea, not elsewhere classified: Secondary | ICD-10-CM

## 2017-10-02 DIAGNOSIS — G4761 Periodic limb movement disorder: Secondary | ICD-10-CM | POA: Diagnosis not present

## 2017-10-02 DIAGNOSIS — R0683 Snoring: Secondary | ICD-10-CM

## 2017-10-02 DIAGNOSIS — G14 Postpolio syndrome: Secondary | ICD-10-CM

## 2017-10-14 NOTE — Procedures (Signed)
PATIENT'S NAME:  Jeffrey Woodward, Jeffrey Woodward DOB:      [birth year]      MR#:    [id number]     DATE OF RECORDING: 10/02/2017- Alfredo Bach REFERRING M.D.:  Jani Gravel, M.D. Study Performed:   Baseline Polysomnogram HISTORY: This 67 year old male patient has been told by his wife that he snores, but he has not woken up from this, endorsed no symptoms of dry mouth, nocturia, nor shortness of breath. He had polio as a child but this did not affect his respiratory system.    Polio, Metabolic syndrome, Obesity with Diabetes Mellitus, Hyperlipidemia, Hypertension, and Sciatica. Multiple orthopedic surgeries.  The patient endorsed the Epworth Sleepiness Scale at 6/24 points, the FSS at 23 points, GDS 2/15.   The patient's weight 222 pounds with a height of 71 (inches), resulting in a BMI of 31.2 kg/m2. The patient's neck circumference measured 17.5 inches.  CURRENT MEDICATIONS: Fenofibrate, Cozaar, Fish oil.   PROCEDURE:  This is a multichannel digital polysomnogram utilizing the Somnostar 11.2 system.  Electrodes and sensors were applied and monitored per AASM Specifications.   EEG, EOG, Chin and Limb EMG, were sampled at 200 Hz.  ECG, Snore and Nasal Pressure, Thermal Airflow, Respiratory Effort, CPAP Flow and Pressure, Oximetry was sampled at 50 Hz. Digital video and audio were recorded.      BASELINE STUDY:  Lights Out was at 22:23 and Lights On at 05:00.  Total recording time (TRT) was 397.5 minutes, with a total sleep time (TST) of 314.5 minutes.  The patient's sleep latency was 55.5 minutes.  REM latency was 180 minutes.  The sleep efficiency was 79.1 %.    SLEEP ARCHITECTURE: WASO (Wake after sleep onset) was 63 minutes.  There were 17.5 minutes in Stage N1, 220 minutes Stage N2, 33.5 minutes Stage N3 and 43.5 minutes in Stage REM.  The percentage of Stage N1 was 5.6%, Stage N2 was 70.%, Stage N3 was 10.7% and Stage R (REM sleep) was 13.8%.   RESPIRATORY ANALYSIS:  There were a total of 9  respiratory events:  0 apneas and 9 hypopneas with 0 respiratory event related arousals (RERAs).The total APNEA/HYPOPNEA INDEX (AHI) was 1.7/hour and the total RESPIRATORY DISTURBANCE INDEX was 1.7 /hour.  6 events occurred in REM sleep and 6 events in NREM. The REM AHI was 8.3 /hour, versus a non-REM AHI of .7. The patient spent 18 minutes of total sleep time in the supine position and 297 minutes in non-supine. The supine AHI was 3.3 versus a non-supine AHI of 1.6.  OXYGEN SATURATION & C02:  The Wake baseline 02 saturation was 92%, with the lowest being 89%.   PERIODIC LIMB MOVEMENTS:  The patient had a total of 205 Periodic Limb Movements.  The Periodic Limb Movement (PLM) index was 39.1 and the PLM Arousal index was 9.3/hour. The arousals were noted as: 46 were spontaneous, 49 were associated with PLMs, and 5 were associated with respiratory events.  Audio and video analysis did not show any abnormal or unusual movements, behaviors, phonations or vocalizations. There were frequent limb movements recorded, but only a fraction let to arousals.  Moderate snoring was noted. EKG was in keeping with normal sinus rhythm (NSR).   IMPRESSION:  1. No evidence of significant sleep apnea.  2. Moderate snoring by RDI. 3. Periodic Limb Movement Disorder (PLMD).   RECOMMENDATIONS:  Further evaluation for frequent PLMs, evaluate for any symptoms of RLS overlap, and evaluate for possible Neuropathy in patient with history  of  Polio.    I certify that I have reviewed the entire raw data recording prior to the issuance of this report in accordance with the Standards of Accreditation of the [place] Academy of [employer] (AASM)    Larey Seat, MD      10-14-2017  Diplomat, American Board of Psychiatry and Neurology  Diplomat, American Board of [place] Director, [place] [facility] [employer]

## 2017-10-15 ENCOUNTER — Telehealth: Payer: Self-pay | Admitting: Neurology

## 2017-10-15 NOTE — Telephone Encounter (Signed)
-----   Message from Larey Seat, MD sent at 10/14/2017  5:43 PM EDT ----- Frequent non periodic limb movements, some periodic limb movements, some arousals.  No apnea,  But moderate snoring, no oxygen desat. Limb movements can be related to sciatica and DM neuropathy, may be to polio.

## 2017-10-15 NOTE — Telephone Encounter (Signed)
Called the patient and he was not available. I was able to speak with his wife and informed her of the sleep study results. Informed her that pt didn't have apnea or issues with low oxygen. I did inform her of the restlessness in his extremities in sleep. I offered to schedule a follow up apt to address this concern and at this time the wife declines apt. She states that if the patient would like to look into that and address treating then they will contact us and schedule at that time. Pt's wife verbalized understanding of the results.

## 2017-10-23 DIAGNOSIS — I2510 Atherosclerotic heart disease of native coronary artery without angina pectoris: Secondary | ICD-10-CM | POA: Diagnosis not present

## 2017-10-23 DIAGNOSIS — I10 Essential (primary) hypertension: Secondary | ICD-10-CM | POA: Diagnosis not present

## 2017-10-23 DIAGNOSIS — R0609 Other forms of dyspnea: Secondary | ICD-10-CM | POA: Diagnosis not present

## 2017-10-23 DIAGNOSIS — E782 Mixed hyperlipidemia: Secondary | ICD-10-CM | POA: Diagnosis not present

## 2017-12-12 DIAGNOSIS — B354 Tinea corporis: Secondary | ICD-10-CM | POA: Diagnosis not present

## 2018-01-20 DIAGNOSIS — E7800 Pure hypercholesterolemia, unspecified: Secondary | ICD-10-CM | POA: Diagnosis not present

## 2018-01-28 DIAGNOSIS — I10 Essential (primary) hypertension: Secondary | ICD-10-CM | POA: Diagnosis not present

## 2018-01-28 DIAGNOSIS — R0681 Apnea, not elsewhere classified: Secondary | ICD-10-CM | POA: Diagnosis not present

## 2018-01-28 DIAGNOSIS — R739 Hyperglycemia, unspecified: Secondary | ICD-10-CM | POA: Diagnosis not present

## 2018-01-28 DIAGNOSIS — E781 Pure hyperglyceridemia: Secondary | ICD-10-CM | POA: Diagnosis not present

## 2018-03-22 DIAGNOSIS — Z23 Encounter for immunization: Secondary | ICD-10-CM | POA: Diagnosis not present

## 2018-05-16 DIAGNOSIS — B351 Tinea unguium: Secondary | ICD-10-CM | POA: Diagnosis not present

## 2018-07-30 DIAGNOSIS — Z125 Encounter for screening for malignant neoplasm of prostate: Secondary | ICD-10-CM | POA: Diagnosis not present

## 2018-07-30 DIAGNOSIS — R739 Hyperglycemia, unspecified: Secondary | ICD-10-CM | POA: Diagnosis not present

## 2018-07-30 DIAGNOSIS — E7800 Pure hypercholesterolemia, unspecified: Secondary | ICD-10-CM | POA: Diagnosis not present

## 2018-07-30 DIAGNOSIS — I10 Essential (primary) hypertension: Secondary | ICD-10-CM | POA: Diagnosis not present

## 2018-08-08 ENCOUNTER — Ambulatory Visit: Payer: Self-pay | Admitting: Cardiology

## 2018-08-14 NOTE — Progress Notes (Signed)
Subjective:  Primary Physician:  Jani Gravel, MD  Patient ID: Jeffrey Woodward, male    DOB: 08/18/1950, 68 y.o.   MRN: [id number]  Chief Complaint  Patient presents with  . Hypertension    F/U for abnormal labs    HPI: Jeffrey Woodward  is a 68 y.o. male  with hypertension, primary hypertriglyceridemia, metabolic syndrome, obesity, history of pancreatitis 11/03/2014, and fatty liver. He has h/o post polio left leg atrophy and also left foot drop after spinal surgery in 2015. Walks with a cane. Has diffuse plaque in proximal and distal aorta and has been intolerant to statins with myalgia and elevated liver enzymes. He is currently enrolled in Esperinon trial.  Patient now presents for 1 year follow up. No chest pain, syncope, or claudication symptoms. Has abnormal vascular exam in the lower extremity  and likely small vessel disease; however, asymptomatic.    Past Medical History:  Diagnosis Date  . Diabetes mellitus without complication ([place])   . Hyperlipidemia   . Hypertension   . Snoring: Sleep study Negative for apnea 2019 09/03/2017    Past Surgical History:  Procedure Laterality Date  . BACK SURGERY    . COLONOSCOPY    . KNEE ARTHROSCOPY Right   . KNEE SURGERY Left   . leg reconstructive surgery Left    left leg had polio had reconstructive surgery on LEFT FOOT [phone]    Social History   Socioeconomic History  . Marital status: Married    Spouse name: Not on file  . Number of children: 2  . Years of education: Not on file  . Highest education level: Not on file  Occupational History  . Not on file  Social Needs  . Financial resource strain: Not on file  . Food insecurity:    Worry: Not on file    Inability: Not on file  . Transportation needs:    Medical: Not on file    Non-medical: Not on file  Tobacco Use  . Smoking status: Never Smoker  . Smokeless tobacco: Never Used  Substance and Sexual Activity  . Alcohol use: No  . Drug use: No  . Sexual  activity: Not on file  Lifestyle  . Physical activity:    Days per week: Not on file    Minutes per session: Not on file  . Stress: Not on file  Relationships  . Social connections:    Talks on phone: Not on file    Gets together: Not on file    Attends religious service: Not on file    Active member of club or organization: Not on file    Attends meetings of clubs or organizations: Not on file    Relationship status: Not on file  . Intimate partner violence:    Fear of current or ex partner: Not on file    Emotionally abused: Not on file    Physically abused: Not on file    Forced sexual activity: Not on file  Other Topics Concern  . Not on file  Social History Narrative  . Not on file    Current Outpatient Medications on File Prior to Visit  Medication Sig Dispense Refill  . aspirin EC 81 MG tablet Take 81 mg by mouth daily.    . fenofibrate 160 MG tablet Take 160 mg by mouth daily.    Marland Kitchen losartan (COZAAR) 100 MG tablet Take 100 mg by mouth daily.   5  . Multiple Vitamin (MULTIVITAMIN) tablet  Take 1 tablet by mouth daily.    . NON FORMULARY Take 1 tablet by mouth daily. Study drug.    . Omega-3 Fatty Acids (FISH OIL) 1000 MG CAPS Take 1,000 mg by mouth 2 (two) times daily.     No current facility-administered medications on file prior to visit.     Review of Systems  Constitutional: Negative for malaise/fatigue and weight loss.  Respiratory: Negative for cough, hemoptysis and shortness of breath.   Cardiovascular: Negative for chest pain, palpitations, claudication and leg swelling.  Gastrointestinal: Negative for abdominal pain, blood in stool, constipation, heartburn and vomiting.  Genitourinary: Negative for dysuria.  Musculoskeletal: Positive for back pain and joint pain (left leg weakness from polio). Negative for myalgias.       Leg Weakness (left foot drop after spinal surgery, h/o polio as child) and Physical Disability (left foot drop after spinal surgery, h/o  polio as child, uses cane to support)  Neurological: Negative for dizziness, focal weakness and headaches.  Endo/Heme/Allergies: Does not bruise/bleed easily.  Psychiatric/Behavioral: Negative for depression. The patient is not nervous/anxious.   All other systems reviewed and are negative.      Objective:  Blood pressure (!) 148/79, pulse 74, SpO2 97 %. There is no height or weight on file to calculate BMI.  Physical Exam  Constitutional: He appears well-developed and well-nourished. No distress.  Mildly obese  HENT:  Head: Atraumatic.  Eyes: Conjunctivae are normal.  Neck: Neck supple. No JVD present. No thyromegaly present.  Short neck and difficult to evaluate JVP  Cardiovascular: Normal rate, regular rhythm, normal heart sounds and intact distal pulses. Exam reveals no gallop.  No murmur heard. Pulses:      Carotid pulses are 2+ on the right side and 2+ on the left side.      Femoral pulses are 2+ on the right side and 2+ on the left side.      Popliteal pulses are 2+ on the right side and 2+ on the left side.       Dorsalis pedis pulses are 2+ on the right side and 0 on the left side.       Posterior tibial pulses are 1+ on the right side and 1+ on the left side.  Femoral and popliteal pulse difficult to feel due to patient's body habitus.   Pulmonary/Chest: Effort normal and breath sounds normal.  Abdominal: Soft. Bowel sounds are normal.  Obese.   Musculoskeletal:        General: No edema.     Comments: Left leg weakness from prior polio  Neurological: He is alert.  Skin: Skin is warm and dry.  Psychiatric: He has a normal mood and affect.    CARDIAC STUDIES:   Sleep study set up Dr. Asencion Partridge Dohmeier 09/03/2017: Normal.  Nuclear stress test03/02/2017: 1. The resting electrocardiogram demonstrated normal sinus rhythm, Left atril abnormality, incomplete RBBB and no resting arrhythmias. Stress EKG is non-diagnostic for ischemia as it a pharmacologic stress using  Lexiscan. Stress symptoms included cold sweat. 2. This is a normal myocardial perfusion imaging study with a fixed defect suggesting an area of soft tissue attenuation in the inferior wall. Overall left ventricular systolic function was normal without regional wall motion abnormalities and calculated at 69%. This is a low risk study.  Abdominal Ultrasound03/01/2017: The maximum aorta diameter is 2.39 cm (dist). Diffuse plaque observed in the proximal and distal aorta. No AAA. Normal flow velocities noted. Iliac artery velocity normal.  Echocardiogram 08/08/2016: 1. Left  ventricle cavity is normal in size. Moderate concentric hypertrophy of the left ventricle. Normal global wall motion. Doppler evidence of grade I (impaired) diastolic dysfunction with elevated LV filling pressure. Calculated EF 59%. 2. Left atrial cavity is mildly dilated. 3. Mild (Grade I) mitral regurgitation. Mild calcification of the mitral valve annulus. 4. Mild tricuspid regurgitation. No evidence of pulmonary hypertension.  CTA chest 10/31/2014: Mild coronary calcification, multiple calcified hilar and mediastinal lymph nodes consistent with prior granulomatous disease. PA demonstrates normal branching, no PND.  Recent Labs:   07/23/2017: Direct LDL 81.  CBC normal.  Creatinine 1.3, EGFR 59/72, potassium 4.4, CMP otherwise normal.  Hemoglobin A1c 5.6%.  Cholesterol 271, triglycerides 686, HDL 33, LDL unable to be calculated. 01/21/2017: CBC normal. Creatinine 1.23, EGFR 60, potassium 4.3, CMP normal. Hemoglobin A1c 5.6%. Cholesterol 148, HDL 25, LDL unable to be calculated, triglycerides 437. Direct LDL 37.   Assessment & Recommendations:   Essential hypertension - Plan: EKG 12-Lead  Aortic atherosclerosis (HCC)  Enrolled in clinical trial of drug: Enrolled in ESPERION 43 Lipid trial - Do not check lipids  Controlled type 2 diabetes mellitus without complication, without long-term current use of insulin (HCC)  Snoring  negative sleep study in 2019  EKG 08/15/2018: Normal sinus rhythm at rate of 65 bpm, normal axis, no evidence of ischemia, normal EKG.  Recommendation:  Patient is here on a six-month office visit and follow-up of hypertriglyceridemia, aortic atherosclerosis, hypertension and patient is presently enrolled in clinical trial for lipids.  He is presently doing well, no change in his physical exam, in spite of his physical disability continues to exercise regularly.  I encouraged him to continue to lose weight.  His blood pressure is elevated today however he states that his blood pressure is perfectly well controlled less than 130 mmHg at home.  Hence I did not make any changes to his medications.  Also states that his diabetes is well controlled, previous A1c was within normal limits.  Last year he complained of fatigue and and sleep study was performed which is negative for sleep apnea.  Overall he is stable from cardiac standpoint, I'll see him back on a p.r.n. basis.  He'll continue to be enrolled in the clinical trial for the lipids.  Although his triglycerides are markedly elevated, I'm not really sure whether it is familial as weight loss and exercise may make a difference in his lipids.  Adrian Prows, MD, [facility] 08/15/2018, 1:43 PM [facility] [place] Pager: [phone] Office: [phone] If no answer Cell [phone]

## 2018-08-15 ENCOUNTER — Other Ambulatory Visit: Payer: Self-pay

## 2018-08-15 ENCOUNTER — Encounter: Payer: Self-pay | Admitting: Cardiology

## 2018-08-15 ENCOUNTER — Ambulatory Visit (INDEPENDENT_AMBULATORY_CARE_PROVIDER_SITE_OTHER): Payer: Medicare Other | Admitting: Cardiology

## 2018-08-15 VITALS — BP 148/79 | HR 74 | Ht 70.0 in | Wt 222.0 lb

## 2018-08-15 DIAGNOSIS — I700 Atherosclerosis of aorta: Secondary | ICD-10-CM | POA: Diagnosis not present

## 2018-08-15 DIAGNOSIS — E119 Type 2 diabetes mellitus without complications: Secondary | ICD-10-CM

## 2018-08-15 DIAGNOSIS — I10 Essential (primary) hypertension: Secondary | ICD-10-CM

## 2018-08-15 DIAGNOSIS — Z006 Encounter for examination for normal comparison and control in clinical research program: Secondary | ICD-10-CM | POA: Diagnosis not present

## 2018-08-15 DIAGNOSIS — R0683 Snoring: Secondary | ICD-10-CM

## 2018-10-21 DIAGNOSIS — L821 Other seborrheic keratosis: Secondary | ICD-10-CM | POA: Diagnosis not present

## 2018-10-21 DIAGNOSIS — B354 Tinea corporis: Secondary | ICD-10-CM | POA: Diagnosis not present

## 2018-12-02 DIAGNOSIS — Z1159 Encounter for screening for other viral diseases: Secondary | ICD-10-CM | POA: Diagnosis not present

## 2019-01-05 ENCOUNTER — Other Ambulatory Visit: Payer: Self-pay

## 2019-03-20 DIAGNOSIS — Z23 Encounter for immunization: Secondary | ICD-10-CM | POA: Diagnosis not present

## 2019-07-02 DIAGNOSIS — R200 Anesthesia of skin: Secondary | ICD-10-CM | POA: Diagnosis not present

## 2019-07-02 DIAGNOSIS — I10 Essential (primary) hypertension: Secondary | ICD-10-CM | POA: Diagnosis not present

## 2019-07-02 DIAGNOSIS — R52 Pain, unspecified: Secondary | ICD-10-CM | POA: Diagnosis not present

## 2019-07-02 DIAGNOSIS — M545 Low back pain: Secondary | ICD-10-CM | POA: Diagnosis not present

## 2019-07-02 DIAGNOSIS — M25561 Pain in right knee: Secondary | ICD-10-CM | POA: Diagnosis not present

## 2019-07-08 DIAGNOSIS — M89662 Osteopathy after poliomyelitis, left lower leg: Secondary | ICD-10-CM | POA: Diagnosis not present

## 2019-07-08 DIAGNOSIS — Z79899 Other long term (current) drug therapy: Secondary | ICD-10-CM | POA: Diagnosis not present

## 2019-07-08 DIAGNOSIS — R5383 Other fatigue: Secondary | ICD-10-CM | POA: Diagnosis not present

## 2019-07-08 DIAGNOSIS — I10 Essential (primary) hypertension: Secondary | ICD-10-CM | POA: Diagnosis not present

## 2019-07-08 DIAGNOSIS — Z125 Encounter for screening for malignant neoplasm of prostate: Secondary | ICD-10-CM | POA: Diagnosis not present

## 2019-07-08 DIAGNOSIS — Z6830 Body mass index (BMI) 30.0-30.9, adult: Secondary | ICD-10-CM | POA: Diagnosis not present

## 2019-07-08 DIAGNOSIS — E782 Mixed hyperlipidemia: Secondary | ICD-10-CM | POA: Diagnosis not present

## 2019-08-05 DIAGNOSIS — M89662 Osteopathy after poliomyelitis, left lower leg: Secondary | ICD-10-CM | POA: Diagnosis not present

## 2019-08-05 DIAGNOSIS — Z0001 Encounter for general adult medical examination with abnormal findings: Secondary | ICD-10-CM | POA: Diagnosis not present

## 2019-08-23 NOTE — Progress Notes (Signed)
Primary Physician/Referring:  System, Pcp Not In  Patient ID: Jeffrey Woodward, male    DOB: 1950/06/23, 69 y.o.   MRN: [id number]  Chief Complaint  Patient presents with  . Hypertension  . Hyperlipidemia    Hypertriglyceridemia   HPI:    Jeffrey Woodward  is a 69 y.o.  hypertension, primary hypertriglyceridemia, metabolic syndrome with hypertension, reduced HDL and prediabetes, obesity, history of pancreatitis 11/03/2014, and fatty liver. He has h/o post polio left leg atrophy and also left foot drop after spinal surgery in 2015. Walks with a cane. Has diffuse plaque in proximal and distal aorta and has been intolerant to statins with myalgia and elevated liver enzymes.   He is currently enrolled in Esperinon trial for statin intolerant patients. Noted to have marked elevation in TG and informed by research coordinator and hence brought in for evaluation. This is also his 1 year follow up. No chest pain, syncope, or claudication symptoms. Has abnormal vascular exam in the lower extremity  and likely small vessel disease; however, asymptomatic.   Past Medical History:  Diagnosis Date  . Diabetes mellitus without complication ([place])   . Hyperlipidemia   . Hypertension   . Snoring: Sleep study Negative for apnea 2019 09/03/2017   Past Surgical History:  Procedure Laterality Date  . BACK SURGERY    . COLONOSCOPY    . KNEE ARTHROSCOPY Right   . KNEE SURGERY Left   . leg reconstructive surgery Left    left leg had polio had reconstructive surgery on LEFT FOOT [phone]   Family History  Problem Relation Age of Onset  . CAD Mother   . Diabetes Mellitus II Mother   . CAD Father   . Stroke Father   . Diabetes Mellitus II Father   . Diabetes Mellitus II Sister   . Diabetes Mellitus II Sister     Social History   Tobacco Use  . Smoking status: Never Smoker  . Smokeless tobacco: Never Used  Substance Use Topics  . Alcohol use: No   ROS  Review of Systems  Cardiovascular:  Negative for chest pain, dyspnea on exertion and leg swelling.  Gastrointestinal: Negative for melena.   Objective  Blood pressure (!) 160/84, pulse 67, temperature 97.8 F (36.6 C), height _0  (1.778 m), weight 222 lb (100.7 kg), SpO2 96 %.  Vitals with BMI 08/24/2019 08/24/2019 08/24/2019  Height - - _1   Weight - - 222 lbs  BMI - - 41.58  Systolic 309 407 680  Diastolic 84 96 90  Pulse - 67 64     Physical Exam  Constitutional: He appears well-developed and well-nourished.  Mildly obese  Neck:  Short neck and difficult to evaluate JVP  Cardiovascular: Normal rate, regular rhythm, normal heart sounds and intact distal pulses. Exam reveals no gallop.  No murmur heard. Pulses:      Carotid pulses are 2+ on the right side and 2+ on the left side.      Femoral pulses are 2+ on the right side and 2+ on the left side.      Popliteal pulses are 2+ on the right side and 2+ on the left side.       Dorsalis pedis pulses are 1+ on the right side and 0 on the left side.       Posterior tibial pulses are 1+ on the right side and 0 on the left side.  Femoral and popliteal pulse difficult to feel due to patient's  body habitus.   Pulmonary/Chest: Effort normal and breath sounds normal.  Abdominal: Soft. Bowel sounds are normal.  Obese.   Musculoskeletal:     Comments: Left leg weakness from prior polio   Laboratory examination:   External labs:   Cholesterol, total 232.000 M 01/20/2018 HDL 20.000 M 01/20/2018 LDL 54.000 07/30/2018 Triglycerides 799 Result 07/30/2018  TSH 0.850 07/30/2018  Creatinine, Serum 1.150 MG/ 01/20/2018 Potassium 4.700 MM 06/13/2016 ALT (SGPT) 20.000 IU/ 01/20/2018  07/23/2017: Direct LDL 81.  CBC normal.  Creatinine 1.3, EGFR 59/72, potassium 4.4, CMP otherwise normal.  Hemoglobin A1c 5.6%.  Cholesterol 271, triglycerides 686, HDL 33, LDL unable to be calculated. 01/21/2017: CBC normal. Creatinine 1.23, EGFR 60, potassium 4.3, CMP normal. Hemoglobin A1c 5.6%.  Cholesterol 148, HDL 25, LDL unable to be calculated, triglycerides 437. Direct LDL 37.   Medications and allergies  No Known Allergies   Current Outpatient Medications  Medication Instructions  . aspirin EC 81 mg, Oral, Daily  . fenofibrate 160 mg, Oral, Daily  . losartan (COZAAR) 100 mg, Oral, Daily  . Multiple Vitamin (MULTIVITAMIN) tablet 1 tablet, Oral, Daily  . NON FORMULARY 1 tablet, Oral, Daily, Study drug.   . omega-3 acid ethyl esters (LOVAZA) 2 g, Oral, 2 times daily   Radiology:   No results found.  Cardiac Studies:   Sleep study set up Dr. Asencion Partridge Dohmeier 09/03/2017: Normal.  Nuclear stress test03/02/2017: 1. The resting electrocardiogram demonstrated normal sinus rhythm, Left atril abnormality, incomplete RBBB and no resting arrhythmias. Stress EKG is non-diagnostic for ischemia as it a pharmacologic stress using Lexiscan. Stress symptoms included cold sweat. 2. This is a normal myocardial perfusion imaging study with a fixed defect suggesting an area of soft tissue attenuation in the inferior wall. Overall left ventricular systolic function was normal without regional wall motion abnormalities and calculated at 69%. This is a low risk study.  Abdominal Ultrasound03/01/2017: The maximum aorta diameter is 2.39 cm (dist). Diffuse plaque observed in the proximal and distal aorta. No AAA. Normal flow velocities noted. Iliac artery velocity normal.  Echocardiogram 08/08/2016: 1. Left ventricle cavity is normal in size. Moderate concentric hypertrophy of the left ventricle. Normal global wall motion. Doppler evidence of grade I (impaired) diastolic dysfunction with elevated LV filling pressure. Calculated EF 59%. 2. Left atrial cavity is mildly dilated. 3. Mild (Grade I) mitral regurgitation. Mild calcification of the mitral valve annulus. 4. Mild tricuspid regurgitation. No evidence of pulmonary hypertension.  CTA chest 10/31/2014: Mild coronary calcification, multiple  calcified hilar and mediastinal lymph nodes consistent with prior granulomatous disease. PA demonstrates normal branching, no PND.  EKG:   EKG 0/22/2021: Normal sinus rhythm with rate of 51 bpm, normal axis, complete heart block.  No evidence of ischemia, normal EKG No significant change from EKG 08/15/2018  Assessment     ICD-10-CM   1. Essential hypertension  I10 EKG 12-Lead  2. Aortic atherosclerosis (HCC)  I70.0   3. Pure hypertriglyceridemia  E78.1 omega-3 acid ethyl esters (LOVAZA) 1 g capsule  4. Enrolled in clinical trial of drug: Enrolled in ESPERION 43 Lipid trial  Z00.6      Meds ordered this encounter  Medications  . omega-3 acid ethyl esters (LOVAZA) 1 g capsule    Sig: Take 2 capsules (2 g total) by mouth 2 (two) times daily.    Dispense:  360 capsule    Refill:  3    Medications Discontinued During This Encounter  Medication Reason  . Omega-3 Fatty Acids (FISH OIL)  1000 MG CAPS Change in therapy    Recommendations:   EXCELL NEYLAND  is a 69 y.o. hypertension, primary hypertriglyceridemia, metabolic syndrome with hypertension, reduced HDL and prediabetes, obesity, history of pancreatitis 11/03/2014, and fatty liver. He has h/o post polio left leg atrophy and also left foot drop after spinal surgery in 2015. Walks with a cane. Has diffuse plaque in proximal and distal aorta and has been intolerant to statins with myalgia and elevated liver enzymes.   Patient remains asymptomatic, unfortunately continues to have elevated triglycerides, he presents for annual visit and also for research visit as a research labs he was noted to have marked elevation in triglycerides.  I will discontinue OTC fish oil capsules and again try omega-3 fatty acid capsule prescription, Lovaza and see if his insurance would cover this.  I suspect part of his elevated triglycerides is related to his diet and lack of physical activity.  He does have peripheral artery disease by physical exam noted by  absent pedal pulses.  However remains asymptomatic hence evaluation for PAD not performed.  I have discussed regarding weight loss and exercise.  Blood pressure is elevated today but patient states that he just drove about 90 miles and blood pressure at home has been well controlled on did not want to make any changes to his medications.  As he is going to establish with a primary cardiologist in [place], I will see him back on a as needed basis, he wishes to continue to follow [place] for research "Esperion" and do not do lipid profile testing due to this.     Adrian Prows, MD, [facility] 08/24/2019, 9:13 AM [facility] [place] Office: [phone]

## 2019-08-24 ENCOUNTER — Ambulatory Visit: Payer: Medicare Other | Admitting: Cardiology

## 2019-08-24 ENCOUNTER — Other Ambulatory Visit: Payer: Self-pay

## 2019-08-24 ENCOUNTER — Encounter: Payer: Self-pay | Admitting: Cardiology

## 2019-08-24 VITALS — BP 160/84 | HR 67 | Temp 97.8°F | Ht 70.0 in | Wt 222.0 lb

## 2019-08-24 DIAGNOSIS — E781 Pure hyperglyceridemia: Secondary | ICD-10-CM

## 2019-08-24 DIAGNOSIS — Z006 Encounter for examination for normal comparison and control in clinical research program: Secondary | ICD-10-CM | POA: Diagnosis not present

## 2019-08-24 DIAGNOSIS — I700 Atherosclerosis of aorta: Secondary | ICD-10-CM | POA: Diagnosis not present

## 2019-08-24 DIAGNOSIS — I10 Essential (primary) hypertension: Secondary | ICD-10-CM

## 2019-08-24 MED ORDER — OMEGA-3-ACID ETHYL ESTERS 1 G PO CAPS: 2.0000 g | ORAL_CAPSULE | Freq: Two times a day (BID) | ORAL | 3 refills | Status: AC

## 2019-09-03 DIAGNOSIS — M130 Polyarthritis, unspecified: Secondary | ICD-10-CM | POA: Diagnosis not present

## 2019-09-03 DIAGNOSIS — M1711 Unilateral primary osteoarthritis, right knee: Secondary | ICD-10-CM | POA: Diagnosis not present

## 2019-09-03 DIAGNOSIS — M542 Cervicalgia: Secondary | ICD-10-CM | POA: Diagnosis not present

## 2019-09-17 ENCOUNTER — Other Ambulatory Visit: Payer: Self-pay | Admitting: Cardiology

## 2019-09-17 DIAGNOSIS — E782 Mixed hyperlipidemia: Secondary | ICD-10-CM

## 2019-09-17 DIAGNOSIS — I700 Atherosclerosis of aorta: Secondary | ICD-10-CM

## 2019-09-17 MED ORDER — OMEGA-3-ACID ETH EST (DIETARY) 1 G PO CAPS
2.0000 | ORAL_CAPSULE | Freq: Two times a day (BID) | ORAL | 6 refills | Status: AC
Start: 2019-09-17 — End: ?

## 2020-03-11 NOTE — Telephone Encounter (Signed)
-----   Message from [name] sent at 03/10/2020 10:00 AM EDT -----  Subject: Message to Provider    QUESTIONS  Information for Provider? Pt would like his wife to be seen as a new pt as   well on Monday 10/11 with PCP [place]. Her name is [name] , MRN   would not populate. She also had BCBS and Medicare. New to the area.   ---------------------------------------------------------------------------  --------------  Cleotis Lema INFO  What is the best way for the office to contact you? OK to leave message on   voicemail  Preferred Call Back Phone Number? [phone]  ---------------------------------------------------------------------------  --------------  SCRIPT ANSWERS  Relationship to Patient? Self

## 2020-03-11 NOTE — Telephone Encounter (Signed)
LVM for pt to return call to schedule new patient for wife. Dr. Clayborne Dana does not have any availability on 10.11.21, can schedule to next available date.

## 2020-03-14 ENCOUNTER — Ambulatory Visit: Admit: 2020-03-14 | Discharge: 2020-03-14 | Payer: MEDICARE | Attending: Internal Medicine | Primary: Internal Medicine

## 2020-03-14 ENCOUNTER — Encounter: Payer: MEDICARE | Attending: Internal Medicine | Primary: Internal Medicine

## 2020-03-14 DIAGNOSIS — Z0000 Encounter for general adult medical examination without abnormal findings: Secondary | ICD-10-CM

## 2020-04-12 ENCOUNTER — Inpatient Hospital Stay: Admit: 2020-04-12 | Payer: MEDICARE | Primary: Internal Medicine

## 2020-04-12 ENCOUNTER — Ambulatory Visit: Admit: 2020-04-12 | Discharge: 2020-04-12 | Payer: MEDICARE | Attending: Internal Medicine | Primary: Internal Medicine

## 2020-04-12 ENCOUNTER — Inpatient Hospital Stay: Payer: MEDICARE | Primary: Internal Medicine

## 2020-04-12 DIAGNOSIS — M25559 Pain in unspecified hip: Secondary | ICD-10-CM

## 2020-04-12 DIAGNOSIS — M25562 Pain in left knee: Secondary | ICD-10-CM

## 2020-04-12 DIAGNOSIS — Z9181 History of falling: Secondary | ICD-10-CM

## 2020-04-12 MED ORDER — CYCLOBENZAPRINE HCL 5 MG PO TABS
5 MG | ORAL_TABLET | Freq: Every evening | ORAL | 0 refills | Status: AC | PRN
Start: 2020-04-12 — End: 2020-04-22

## 2020-04-12 MED ORDER — PREDNISONE 10 MG PO TABS
10 MG | ORAL_TABLET | Freq: Every day | ORAL | 0 refills | Status: AC
Start: 2020-04-12 — End: 2020-04-19

## 2020-04-12 NOTE — Telephone Encounter (Signed)
Received call from [facility] at [facility] with [employer] Complaint.    Brief description of triage: Larey Seat yesterday, hurt left hip, wife calling for patient but he is present.  Has polio, was walking the dog and lost his balance, left hip pain, has Biofreeze not helping, also states left knee is painful     Triage indicates for patient to be seen now in office     Care advice provided, patient verbalizes understanding; denies any other questions or concerns; instructed to call back for any new or worsening symptoms.    Writer provided warm transfer to [employer] at [facility] for appointment scheduling.    Attention Provider:  Thank you for allowing me to participate in the care of your patient.  The patient was connected to triage in response to information provided to the ECC/PSC.  Please do not respond through this encounter as the response is not directed to a shared pool.          Reason for Disposition  ??? SEVERE pain (e.g., excruciating, unable to do any normal activities)    Answer Assessment - Initial Assessment Questions  1. LOCATION and RADIATION: "Where is the pain located?"       Left hip and knee radiate down his leg     2. QUALITY: "What does the pain feel like?"  (e.g., sharp, dull, aching, burning)      Sharp     3. SEVERITY: "How bad is the pain?" "What does it keep you from doing?"   (Scale 1-10; or mild, moderate, severe)    -  MILD (1-3): doesn't interfere with normal activities     -  MODERATE (4-7): interferes with normal activities (e.g., work or school) or awakens from sleep, limping     -  SEVERE (8-10): excruciating pain, unable to do any normal activities, unable to walk      8/10    4. ONSET: "When did the pain start?" "Does it come and go, or is it there all the time?"      Last night, constant     5. WORK OR EXERCISE: "Has there been any recent work or exercise that involved this part of the body?"       Denies     6. CAUSE: "What do you think is causing the hip pain?"       Fell last  night     7. AGGRAVATING FACTORS: "What makes the hip pain worse?" (e.g., walking, climbing stairs, running)      Walking     8. OTHER SYMPTOMS: "Do you have any other symptoms?" (e.g., back pain, pain shooting down leg,  fever, rash)      Pain shooting down leg    Protocols used: HIP PAIN-ADULT-OH

## 2020-04-12 NOTE — Other (Signed)
Please notify patient that xray left hip showed moderate left hip osteoarthritis. No acute fracture noted. Also shows diffuse osteopenia.

## 2020-04-12 NOTE — Progress Notes (Signed)
[facility]  [facility]  [address]  [address]  [place] [address]  Dept: [phone]  Dept Fax: [phone]    Danny Wyatt is a 69 y.o. male who presents today for his medical conditions/complaints as noted below.    Chief Complaint   Patient presents with   ??? Fall   ??? Hip Pain     left   ??? Knee Pain     left       HPI:     69 y.o.M presented to office today as an urgent visit post fall.  He was accompanied by his wife.     Patient mentioned that yesterday when he took his dog for walk in the evening he tripped over and fell on his left side.  Since then he has been having pain in the left knee and left hip.  He denied hitting his head on the ground.  Denied any back pain, numbness and tingling, change in bowel bladder function or weakness in bilateral lower extremities.  On evaluation his left knee more swollen along with minimal erythema.  No bruising or warmth noted.  He has been using cane for ambulation and is able to bear weight.  No other current complaints.    Vital signs stable.    Advised lifestyle changes.  Medications reviewed and refilled as appropriate, problem list updated.   All concerns discussed in details and all questions answered to patient satisfaction.            No results found for: LABA1C          ( goal A1C is < 7)   No results found for: LABMICR  No results found for: LDLCHOLESTEROL, LDLCALC    (goal LDL is <100)   No results found for: AST, ALT, BUN  BP Readings from Last 3 Encounters:   04/12/20 138/78   03/14/20 122/68          (goal 120/80)    History reviewed. No pertinent past medical history.   History reviewed. No pertinent surgical history.    History reviewed. No pertinent family history.    Social History     Tobacco Use   ??? Smoking status: Never Smoker   ??? Smokeless tobacco: Never Used   Substance Use Topics   ??? Alcohol use: Not Currently      Current Outpatient Medications   Medication Sig Dispense Refill   ??? Omega-3 Fatty  Acids (FISH OIL) 1000 MG CAPS Take 3,000 mg by mouth 2 times daily     ??? predniSONE (DELTASONE) 10 MG tablet Take 1 tablet by mouth daily for 7 days Take 1 tab with meals daily. 7 tablet 0   ??? cyclobenzaprine (FLEXERIL) 5 MG tablet Take 1 tablet by mouth nightly as needed for Muscle spasms (muscle pains) 10 tablet 0   ??? fenofibrate (TRIGLIDE) 160 MG tablet fenofibrate 160 mg tablet     ??? Losartan Potassium (COZAAR PO) losartan       No current facility-administered medications for this visit.     No Known Allergies    Health Maintenance   Topic Date Due   ??? Potassium monitoring  Never done   ??? Creatinine monitoring  Never done   ??? Hepatitis C screen  Never done   ??? DTaP/Tdap/Td vaccine (1 - Tdap) Never done   ??? Lipid screen  Never done   ??? Shingles Vaccine (1 of 2) Never done   ??? Pneumococcal  65+ years Vaccine (1 of 1 - PPSV23) Never done   ??? COVID-19 Vaccine (3 - Booster) 02/03/2020   ??? Annual Wellness Visit (AWV)  03/15/2021   ??? Colon cancer screen colonoscopy  03/03/2029   ??? Flu vaccine  Completed   ??? Hepatitis A vaccine  Aged Out   ??? Hepatitis B vaccine  Aged Out   ??? Hib vaccine  Aged Out   ??? Meningococcal (ACWY) vaccine  Aged Out       Subjective:      Review of Systems   Constitutional: Negative for chills, fatigue and fever.   HENT: Negative for congestion, hearing loss and rhinorrhea.    Eyes: Negative for discharge and itching.   Respiratory: Negative for cough, shortness of breath and wheezing.    Cardiovascular: Negative for chest pain, palpitations and leg swelling.   Gastrointestinal: Negative for abdominal distention and abdominal pain.   Endocrine: Negative for polydipsia and polyphagia.   Genitourinary: Negative for difficulty urinating and dysuria.   Musculoskeletal: Positive for joint swelling. Negative for arthralgias and back pain.        Left knee and left hip pain present.    Swelling of left knee present     Skin: Negative for rash and wound.   Neurological: Negative for dizziness, seizures,  light-headedness and headaches.   Psychiatric/Behavioral: Negative for agitation and behavioral problems.       Objective:     Physical Exam  Constitutional:       Appearance: He is well-developed.   HENT:      Head: Normocephalic and atraumatic.   Eyes:      Conjunctiva/sclera: Conjunctivae normal.      Pupils: Pupils are equal, round, and reactive to light.   Neck:      Thyroid: No thyromegaly.      Vascular: No JVD.   Cardiovascular:      Rate and Rhythm: Normal rate and regular rhythm.      Heart sounds: Normal heart sounds. No murmur heard.      Pulmonary:      Effort: Pulmonary effort is normal.      Breath sounds: Normal breath sounds. No wheezing.   Abdominal:      General: Bowel sounds are normal. There is no distension.      Palpations: Abdomen is soft.      Tenderness: There is no abdominal tenderness. There is no rebound.   Musculoskeletal:         General: Swelling and tenderness present.      Cervical back: Normal range of motion and neck supple.      Comments: Left knee tenderness and swelling present.  Restricted range of motion of left knee.  Left hip tenderness present.   Skin:     Findings: Erythema present.      Comments: Minimal erythema over left knee present   Neurological:      Mental Status: He is alert and oriented to person, place, and time.      Cranial Nerves: No cranial nerve deficit.   Psychiatric:         Behavior: Behavior normal.       BP 138/78    Pulse 72    Resp 16    Wt 226 lb (102.5 kg)    SpO2 97%    BMI 31.52 kg/m??     Assessment:          1. Status post fall    - XR HIP LEFT (  2-3 VIEWS); Future  - predniSONE (DELTASONE) 10 MG tablet; Take 1 tablet by mouth daily for 7 days Take 1 tab with meals daily.  Dispense: 7 tablet; Refill: 0  - cyclobenzaprine (FLEXERIL) 5 MG tablet; Take 1 tablet by mouth nightly as needed for Muscle spasms (muscle pains)  Dispense: 10 tablet; Refill: 0    2. Hip pain    - XR HIP LEFT (2-3 VIEWS); Future  - predniSONE (DELTASONE) 10 MG tablet; Take 1  tablet by mouth daily for 7 days Take 1 tab with meals daily.  Dispense: 7 tablet; Refill: 0  - cyclobenzaprine (FLEXERIL) 5 MG tablet; Take 1 tablet by mouth nightly as needed for Muscle spasms (muscle pains)  Dispense: 10 tablet; Refill: 0    3. Acute pain of left knee    - XR KNEE LEFT (3 VIEWS); Future  - predniSONE (DELTASONE) 10 MG tablet; Take 1 tablet by mouth daily for 7 days Take 1 tab with meals daily.  Dispense: 7 tablet; Refill: 0  - cyclobenzaprine (FLEXERIL) 5 MG tablet; Take 1 tablet by mouth nightly as needed for Muscle spasms (muscle pains)  Dispense: 10 tablet; Refill: 0            Diagnosis Orders   1. Status post fall     2. Hip pain  XR HIP LEFT (2-3 VIEWS)    predniSONE (DELTASONE) 10 MG tablet    cyclobenzaprine (FLEXERIL) 5 MG tablet   3. Acute pain of left knee  XR KNEE LEFT (3 VIEWS)    predniSONE (DELTASONE) 10 MG tablet    cyclobenzaprine (FLEXERIL) 5 MG tablet           Plan:      Return in about 6 months (around 10/10/2020).    Orders Placed This Encounter   Procedures   ??? XR HIP LEFT (2-3 VIEWS)     Standing Status:   Future     Standing Expiration Date:   04/12/2021     Order Specific Question:   Reason for exam:     Answer:   Hip pain   ??? XR KNEE LEFT (3 VIEWS)     Standing Status:   Future     Standing Expiration Date:   04/12/2021     Order Specific Question:   Reason for exam:     Answer:   acute left knee pain     Orders Placed This Encounter   Medications   ??? predniSONE (DELTASONE) 10 MG tablet     Sig: Take 1 tablet by mouth daily for 7 days Take 1 tab with meals daily.     Dispense:  7 tablet     Refill:  0   ??? cyclobenzaprine (FLEXERIL) 5 MG tablet     Sig: Take 1 tablet by mouth nightly as needed for Muscle spasms (muscle pains)     Dispense:  10 tablet     Refill:  0         Patient given educational materials - see patient instructions.  Discussed use, benefit, and side effects of prescribedmedications.  All patient questions answered. Pt voiced understanding. Reviewed health  maintenance.  Instructed to continue current medications, diet and exercise.  Patient agreed with treatment plan. Follow up as directed.    I spent a total of 20-29 minutes face to face with this patient.  Over 50% of that time was spent on counseling and care coordination.  Please see assessment and plan for details.  Electronically signed by   Jerlyn Lyilnoor Averyanna Sax, MD on 04/12/2020 at 11:27 AM  [facility].        Please note that this chart was generated using voice recognition Scientist, clinical (histocompatibility and immunogenetics)Dragon dictation software.  Although every effort was made to ensure the accuracy of this automatedtranscription, some errors in transcription may have occurred.

## 2020-05-13 NOTE — Telephone Encounter (Signed)
Last Visit Date: 04/12/2020   Next Visit Date: 06/14/2020

## 2020-05-16 MED ORDER — LOSARTAN POTASSIUM 100 MG PO TABS
100 MG | ORAL_TABLET | ORAL | 3 refills | Status: AC
Start: 2020-05-16 — End: ?

## 2020-06-14 ENCOUNTER — Encounter: Attending: Internal Medicine | Primary: Internal Medicine
